# Patient Record
Sex: Male | Born: 1990 | Race: White | Hispanic: No | Marital: Single | Smoking: Current every day smoker
Health system: Southern US, Community
[De-identification: ages and names within clinical notes are randomized; demographics above are authoritative.]

## PROBLEM LIST (undated history)

## (undated) DIAGNOSIS — M24419 Recurrent dislocation, unspecified shoulder: Secondary | ICD-10-CM

## (undated) DIAGNOSIS — L02215 Cutaneous abscess of perineum: Secondary | ICD-10-CM

## (undated) DIAGNOSIS — F419 Anxiety disorder, unspecified: Secondary | ICD-10-CM

## (undated) DIAGNOSIS — R21 Rash and other nonspecific skin eruption: Secondary | ICD-10-CM

## (undated) DIAGNOSIS — M24411 Recurrent dislocation, right shoulder: Secondary | ICD-10-CM

## (undated) DIAGNOSIS — K219 Gastro-esophageal reflux disease without esophagitis: Secondary | ICD-10-CM

## (undated) DIAGNOSIS — G43909 Migraine, unspecified, not intractable, without status migrainosus: Secondary | ICD-10-CM

## (undated) HISTORY — DX: Migraine, unspecified, not intractable, without status migrainosus: G43.909

## (undated) HISTORY — PX: SHOULDER SURGERY: SHX246

## (undated) HISTORY — DX: Cutaneous abscess of perineum: L02.215

## (undated) HISTORY — PX: WISDOM TOOTH EXTRACTION: SHX21

---
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
## 2010-04-10 ENCOUNTER — Emergency Department (HOSPITAL_COMMUNITY): Admission: EM | Admit: 2010-04-10 | Discharge: 2010-04-10 | Payer: Self-pay | Admitting: Emergency Medicine

## 2011-12-22 HISTORY — PX: LEG SURGERY: SHX1003

## 2012-04-22 ENCOUNTER — Emergency Department (HOSPITAL_COMMUNITY)
Admission: EM | Admit: 2012-04-22 | Discharge: 2012-04-23 | Disposition: A | Discharge status: Home / Self Care | Payer: Medicaid Other | Attending: Emergency Medicine | Admitting: Emergency Medicine

## 2012-04-22 ENCOUNTER — Encounter (HOSPITAL_COMMUNITY): Payer: Self-pay | Admitting: *Deleted

## 2012-04-22 DIAGNOSIS — IMO0002 Reserved for concepts with insufficient information to code with codable children: Secondary | ICD-10-CM | POA: Insufficient documentation

## 2012-04-22 DIAGNOSIS — F17200 Nicotine dependence, unspecified, uncomplicated: Secondary | ICD-10-CM | POA: Insufficient documentation

## 2012-04-22 DIAGNOSIS — S81819A Laceration without foreign body, unspecified lower leg, initial encounter: Secondary | ICD-10-CM

## 2012-04-22 DIAGNOSIS — T07XXXA Unspecified multiple injuries, initial encounter: Secondary | ICD-10-CM

## 2012-04-22 DIAGNOSIS — S81009A Unspecified open wound, unspecified knee, initial encounter: Secondary | ICD-10-CM | POA: Insufficient documentation

## 2012-04-22 DIAGNOSIS — Z23 Encounter for immunization: Secondary | ICD-10-CM | POA: Insufficient documentation
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
## 2012-04-23 LAB — RAPID URINE DRUG SCREEN, HOSP PERFORMED
Amphetamines: NOT DETECTED
Barbiturates: NOT DETECTED
Benzodiazepines: NOT DETECTED

## 2012-04-23 MED ORDER — TETANUS-DIPHTH-ACELL PERTUSSIS 5-2.5-18.5 LF-MCG/0.5 IM SUSP
0.5000 mL | Freq: Once | INTRAMUSCULAR | Status: AC
  Administered 2012-04-23: 0.5 mL via INTRAMUSCULAR
  Filled 2012-04-23: qty 0.5

## 2012-04-23 MED ORDER — CEPHALEXIN 500 MG PO CAPS
500.0000 mg | ORAL_CAPSULE | Freq: Once | ORAL | Status: AC
  Administered 2012-04-23: 500 mg via ORAL
  Filled 2012-04-23: qty 1

## 2012-04-23 MED ORDER — BUPIVACAINE HCL (PF) 0.25 % IJ SOLN
INTRAMUSCULAR | Status: AC
  Filled 2012-04-23: qty 30

## 2012-04-23 MED ORDER — HYDROCODONE-ACETAMINOPHEN 5-325 MG PO TABS: 1.0000 | ORAL_TABLET | ORAL | Status: AC | PRN

## 2012-04-23 MED ORDER — CEPHALEXIN 500 MG PO CAPS: 500.0000 mg | ORAL_CAPSULE | Freq: Four times a day (QID) | ORAL | Status: AC

## 2012-04-23 NOTE — Discharge Instructions (Signed)
The staples will need to be removed in 10 days. You may return to the ER or see your doctor. Take all of the antibiotic.
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
## 2012-04-23 NOTE — ED Provider Notes (Deleted)
History     CSN: [id number]  Arrival date & time 04/22/12  2348   First MD Initiated Contact with Patient 04/22/12 2356      Chief Complaint  Patient presents with  . Leg Injury    (Consider location/radiation/quality/duration/timing/severity/associated sxs/prior treatment) HPI  History reviewed. No pertinent past medical history.  Past Surgical History  Procedure Date  . Shoulder surgery     No family history on file.  History  Substance Use Topics  . Smoking status: Current Everyday Smoker  . Smokeless tobacco: Not on file  . Alcohol Use: Yes      Review of Systems  Allergies  Review of patient's allergies indicates no known allergies.  Home Medications  No current outpatient prescriptions on file.  BP 132/81  Pulse 89  Temp(Src) 98.5 F (36.9 C) (Oral)  Resp 20  Ht 5\' 7"  (1.702 m)  Wt 145 lb (65.772 kg)  BMI 22.71 kg/m2  SpO2 99%  Physical Exam  ED Course  LACERATION REPAIR Date/Time: 04/23/2012 1:16 AM Performed by: Kathie Dike Authorized by: Kathie Dike Consent: Verbal consent obtained. Risks and benefits: risks, benefits and alternatives were discussed Consent given by: patient and parent Patient understanding: patient states understanding of the procedure being performed Patient identity confirmed: arm band Time out: Immediately prior to procedure a "time out" was called to verify the correct patient, procedure, equipment, support staff and site/side marked as required. Body area: lower extremity Location details: right lower leg Laceration length: 18.3 cm Foreign bodies: no foreign bodies Tendon involvement: none Nerve involvement: none Vascular damage: no Anesthesia: local infiltration Local anesthetic: bupivacaine 0.25% without epinephrine Patient sedated: no Preparation: Patient was prepped and draped in the usual sterile fashion. Irrigation solution: saline Amount of cleaning: extensive (surgical brush) Debridement:  none Degree of undermining: none Skin closure: staples Number of sutures: 31 Approximation: close Approximation difficulty: complex Dressing: 4x4 sterile gauze Patient tolerance: Patient tolerated the procedure well with no immediate complications. Comments: Sterile dressing applied by me.   (including critical care time)   Labs Reviewed  URINE RAPID DRUG SCREEN (HOSP PERFORMED)  ETHANOL   No results found.   No diagnosis found.    MDM  Medical screening examination/treatment/procedure(s) were conducted as a shared visit with non-physician practitioner(s) and myself.  I personally evaluated the patient during the encounter        Nicoletta Dress. Colon Branch, MD 04/23/12 0347

## 2012-05-02 ENCOUNTER — Emergency Department (HOSPITAL_COMMUNITY)
Admission: EM | Admit: 2012-05-02 | Discharge: 2012-05-02 | Disposition: A | Discharge status: Home / Self Care | Payer: Medicaid Other | Attending: Emergency Medicine | Admitting: Emergency Medicine

## 2012-05-02 ENCOUNTER — Encounter (HOSPITAL_COMMUNITY): Payer: Self-pay | Admitting: *Deleted

## 2012-05-02 DIAGNOSIS — Z5189 Encounter for other specified aftercare: Secondary | ICD-10-CM

## 2012-05-02 DIAGNOSIS — Z4802 Encounter for removal of sutures: Secondary | ICD-10-CM | POA: Insufficient documentation

## 2012-05-02 NOTE — Discharge Instructions (Signed)
Wound Care Wound care helps prevent pain and infection.  You may need a tetanus shot if:  You cannot remember when you had your last tetanus shot.   You have never had a tetanus shot.   The injury broke your skin.  If you need a tetanus shot and you choose not to have one, you may get tetanus. Sickness from tetanus can be serious. HOME CARE   Only take medicine as told by your doctor.   Clean the wound daily with mild soap and water.   Change any bandages (dressings) as told by your doctor.   Put medicated cream and a bandage on the wound as told by your doctor.   Change the bandage if it gets wet, dirty, or starts to smell.   Take showers. Do not take baths, swim, or do anything that puts your wound under water.   Rest and raise (elevate) the wound until the pain and puffiness (swelling) are better.   Keep all doctor visits as told.  GET HELP RIGHT AWAY IF:   Yellowish-white fluid (pus) comes from the wound.   Medicine does not lessen your pain.   There is a red streak going away from the wound.   You cannot move your finger or toe.   You have a fever.  MAKE SURE YOU:   Understand these instructions.   Will watch your condition.   Will get help right away if you are not doing well or get worse.  Document Released: 09/15/2008 Document Revised: 11/26/2011 Document Reviewed: 04/12/2011 Exit[employer] Patient Information 2012 [employer]. 

## 2012-05-02 NOTE — ED Notes (Signed)
Here for staple removal from rt leg.

## 2012-05-02 NOTE — ED Notes (Signed)
Pt here for staples to be removed from right lower leg

## 2012-05-02 NOTE — ED Provider Notes (Signed)
History     CSN: [id number]  Arrival date & time 05/02/12  1228   First MD Initiated Contact with Patient 05/02/12 1356      Chief Complaint  Patient presents with  . Wound Check    (Consider location/radiation/quality/duration/timing/severity/associated sxs/prior treatment) Patient is a 21 y.o. male presenting with wound check. The history is provided by the patient.  Wound Check  He was treated in the ED 5 to 10 days ago. Previous treatment in the ED includes laceration repair. Treatments since wound repair include oral antibiotics. Fever duration: no fever. Wound drainage status: minimal. There is no redness present. There is no swelling present. The pain has improved. Difficulty Moving Extremity/Digit: Pain with fully extending the right knee.    History reviewed. No pertinent past medical history.  Past Surgical History  Procedure Date  . Shoulder surgery     History reviewed. No pertinent family history.  History  Substance Use Topics  . Smoking status: Current Everyday Smoker  . Smokeless tobacco: Not on file  . Alcohol Use: Yes      Review of Systems  Constitutional: Negative for activity change.       All ROS Neg except as noted in HPI  HENT: Negative for nosebleeds and neck pain.   Eyes: Negative for photophobia and discharge.  Respiratory: Negative for cough, shortness of breath and wheezing.   Cardiovascular: Negative for chest pain and palpitations.  Gastrointestinal: Negative for abdominal pain and blood in stool.  Genitourinary: Negative for dysuria, frequency and hematuria.  Musculoskeletal: Negative for back pain and arthralgias.  Skin: Positive for wound.  Neurological: Negative for dizziness, seizures and speech difficulty.  Psychiatric/Behavioral: Negative for hallucinations and confusion.    Allergies  Review of patient's allergies indicates no known allergies.  Home Medications   Current Outpatient Rx  Name Route Sig Dispense Refill  .  CEPHALEXIN 500 MG PO CAPS Oral Take 1 capsule (500 mg total) by mouth 4 (four) times daily. 28 capsule 0  . HYDROCODONE-ACETAMINOPHEN 5-325 MG PO TABS Oral Take 1 tablet by mouth every 4 (four) hours as needed for pain. 15 tablet 0    BP 116/70  Pulse 53  Temp 98.2 F (36.8 C)  Resp 18  Ht 5\' 7"  (1.702 m)  Wt 165 lb (74.844 kg)  BMI 25.84 kg/m2  SpO2 99%  Physical Exam  Nursing note and vitals reviewed. Constitutional: He is oriented to person, place, and time. He appears well-developed and well-nourished.  Non-toxic appearance.  HENT:  Head: Normocephalic.  Right Ear: Tympanic membrane and external ear normal.  Left Ear: Tympanic membrane and external ear normal.  Eyes: EOM and lids are normal. Pupils are equal, round, and reactive to light.  Neck: Normal range of motion. Neck supple. Carotid bruit is not present.  Cardiovascular: Normal rate, regular rhythm, normal heart sounds, intact distal pulses and normal pulses.   Pulmonary/Chest: Breath sounds normal. No respiratory distress.  Abdominal: Soft. Bowel sounds are normal. There is no tenderness. There is no guarding.  Musculoskeletal: Normal range of motion.       Pain and difficulty extending the knee of the  right lower extremity. The stapled wound is healing nicely. No red streaking. No active drainage. No hot areas.  Lymphadenopathy:       Head (right side): No submandibular adenopathy present.       Head (left side): No submandibular adenopathy present.    He has no cervical adenopathy.  Neurological: He is alert and  oriented to person, place, and time. He has normal strength. No cranial nerve deficit or sensory deficit.  Skin: Skin is warm and dry.  Psychiatric: He has a normal mood and affect. His speech is normal.    ED Course  Procedures (including critical care time)  Labs Reviewed - No data to display No results found.   No diagnosis found.    MDM  I have reviewed nursing notes, vital signs, and all  appropriate lab and imaging results for this patient. Pt required a wound repair of the right lower leg on 5/4. He returns today for suture removal . Wound healing nicely. No evidence for infection.       Kathie Dike, [place] 05/02/12 1440

## 2012-05-03 NOTE — ED Provider Notes (Signed)
Medical screening examination/treatment/procedure(s) were performed by non-physician practitioner and as supervising physician I was immediately available for consultation/collaboration.   Laray Anger, DO 05/03/12 [phone]

## 2012-05-16 ENCOUNTER — Emergency Department (HOSPITAL_COMMUNITY)
Admission: EM | Admit: 2012-05-16 | Discharge: 2012-05-16 | Disposition: A | Discharge status: Home / Self Care | Payer: Medicaid Other | Attending: Emergency Medicine | Admitting: Emergency Medicine

## 2012-05-16 ENCOUNTER — Encounter (HOSPITAL_COMMUNITY): Payer: Self-pay | Admitting: Emergency Medicine

## 2012-05-16 DIAGNOSIS — T8130XA Disruption of wound, unspecified, initial encounter: Secondary | ICD-10-CM

## 2012-05-16 DIAGNOSIS — T8133XA Disruption of traumatic injury wound repair, initial encounter: Secondary | ICD-10-CM | POA: Insufficient documentation

## 2012-05-16 DIAGNOSIS — F17200 Nicotine dependence, unspecified, uncomplicated: Secondary | ICD-10-CM | POA: Insufficient documentation

## 2012-05-16 DIAGNOSIS — Y849 Medical procedure, unspecified as the cause of abnormal reaction of the patient, or of later complication, without mention of misadventure at the time of the procedure: Secondary | ICD-10-CM | POA: Insufficient documentation
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
## 2012-07-01 ENCOUNTER — Ambulatory Visit (HOSPITAL_COMMUNITY)
Admission: RE | Admit: 2012-07-01 | Discharge: 2012-07-01 | Disposition: A | Discharge status: Home / Self Care | Payer: Self-pay | Source: Ambulatory Visit | Attending: Preventative Medicine | Admitting: Preventative Medicine

## 2012-07-01 DIAGNOSIS — IMO0001 Reserved for inherently not codable concepts without codable children: Secondary | ICD-10-CM | POA: Insufficient documentation

## 2012-07-01 DIAGNOSIS — S81009A Unspecified open wound, unspecified knee, initial encounter: Secondary | ICD-10-CM | POA: Insufficient documentation
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
## 2012-07-04 ENCOUNTER — Ambulatory Visit (HOSPITAL_COMMUNITY): Payer: Medicaid Other | Admitting: Physical Therapy

## 2012-07-04 ENCOUNTER — Ambulatory Visit (HOSPITAL_COMMUNITY)
Admission: RE | Admit: 2012-07-04 | Discharge: 2012-07-04 | Disposition: A | Discharge status: Home / Self Care | Payer: Self-pay | Source: Ambulatory Visit | Attending: Physical Therapy | Admitting: Physical Therapy
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
## 2012-07-06 ENCOUNTER — Ambulatory Visit (HOSPITAL_COMMUNITY)
Admission: RE | Admit: 2012-07-06 | Discharge: 2012-07-06 | Disposition: A | Discharge status: Home / Self Care | Payer: Self-pay | Source: Ambulatory Visit

## 2012-07-06 NOTE — Progress Notes (Signed)
Physical Therapy - Wound Therapy  Treatment   Patient Details  Name: Rodney Brown MRN: [id number] Date of Birth: 03/03/1991  Today's Date: 07/06/2012 Time: 0940-1006 Time Calculation (min): 26 min  Visit#: 3  of 10   Re-eval: 07/15/12  Subjective Subjective Assessment Subjective: Pt states he is not as tender.    Pain Assessment Pain Assessment Pain Assessment: No/denies pain  Wound Therapy Wound 07/01/12 Degloving Leg Right;Lateral wound is triangular in shape. (Active)  Site / Wound Assessment Clean;Granulation tissue;Yellow 07/06/2012 10:08 AM  % Wound base Red or Granulating 40% 07/06/2012 10:08 AM  % Wound base Yellow 60% 07/06/2012 10:08 AM  % Wound base Black 0% 07/01/2012  1:52 PM  % Wound base Other (Comment) 0% 07/01/2012  1:52 PM  Peri-wound Assessment Intact 07/06/2012 10:08 AM  Wound Length (cm) 7 cm 07/04/2012  5:40 PM  Wound Width (cm) 4 cm 07/04/2012  5:40 PM  Wound Depth (cm) 0.3 cm 07/04/2012  5:40 PM  Margins Attached edges (approximated) 07/01/2012  1:52 PM  Closure None 07/06/2012 10:08 AM  Drainage Amount Minimal 07/06/2012 10:08 AM  Drainage Description Serosanguineous 07/06/2012 10:08 AM  Non-staged Wound Description Full thickness 07/06/2012 10:08 AM  Treatment Debridement (Selective);Hydrotherapy (Pulse lavage);Cleansed 07/06/2012 10:08 AM  Dressing Type Impregnated gauze (petrolatum) 07/06/2012 10:08 AM  Dressing Changed Changed 07/06/2012 10:08 AM  Dressing Status Clean;Dry;Intact 07/06/2012 10:08 AM   Hydrotherapy Pulsed lavage therapy - wound location: entire wound area Pulsed Lavage with Suction (psi): 100 psi Pulsed Lavage with Suction - Normal Saline Used: 1000 mL Pulsed Lavage Tip: Tip with splash shield Selective Debridement Selective Debridement - Location: wound bed Selective Debridement - Tools Used: Forceps;Scalpel Selective Debridement - Tissue Removed: slough   Physical Therapy Assessment and Plan Wound Therapy -  Assess/Plan/Recommendations Wound Therapy - Clinical Statement: Pt wound continues to increase in granulating tissue.  Pt much more tolerant of debridement Hydrotherapy Plan: Debridement;Dressing change;Pulsatile lavage with suction;Patient/family education Wound Therapy - Frequency: 3X / week Wound Therapy - Current Recommendations: PT Wound Plan: Decrease to 3x /week continue until wound is 100% granulating tissue       Goals    Problem List There is no problem list on file for this patient.   GP    RUSSELL,CINDY 07/06/2012, 10:20 AM

## 2012-07-08 ENCOUNTER — Ambulatory Visit (HOSPITAL_COMMUNITY)
Admission: RE | Admit: 2012-07-08 | Discharge: 2012-07-08 | Disposition: A | Discharge status: Home / Self Care | Payer: Self-pay | Source: Ambulatory Visit

## 2012-07-08 NOTE — Progress Notes (Signed)
Physical Therapy - Wound Therapy  Treatment   Patient Details  Name: Rodney Brown MRN: [id number] Date of Birth: 1991-11-09  Today's Date: 07/08/2012 Time: 0940-1005 Time Calculation (min): 25 min  Visit#: 3  of 10   Re-eval: 07/15/12  Subjective Subjective Assessment Subjective: Pt states the wound is starting to itch  Pain Assessment Pain Assessment Pain Assessment: No/denies pain  Wound Therapy Wound 07/01/12 Degloving Leg Right;Lateral wound is triangular in shape. (Active)  Site / Wound Assessment Clean;Granulation tissue;Yellow 07/08/2012 11:15 AM  % Wound base Red or Granulating 50% 07/08/2012 11:15 AM  % Wound base Yellow 50% 07/08/2012 11:15 AM  % Wound base Black 0% 07/01/2012  1:52 PM  % Wound base Other (Comment) 0% 07/01/2012  1:52 PM  Peri-wound Assessment Intact 07/06/2012 10:08 AM  Wound Length (cm) 7 cm 07/04/2012  5:40 PM  Wound Width (cm) 4 cm 07/04/2012  5:40 PM  Wound Depth (cm) 0.3 cm 07/04/2012  5:40 PM  Margins Unattacted edges (unapproximated) 07/08/2012 11:15 AM  Closure None 07/08/2012 11:15 AM  Drainage Amount Minimal 07/08/2012 11:15 AM  Drainage Description Serous 07/08/2012 11:15 AM  Non-staged Wound Description Full thickness 07/08/2012 11:15 AM  Treatment Cleansed;Debridement (Selective) 07/08/2012 11:15 AM  Dressing Type Impregnated gauze (petrolatum) 07/08/2012 11:15 AM  Dressing Changed Changed 07/08/2012 11:15 AM  Dressing Status Clean;Intact 07/08/2012 11:15 AM   Selective Debridement Selective Debridement - Location: entire wound area Selective Debridement - Tools Used: Forceps Selective Debridement - Tissue Removed: slough   Physical Therapy Assessment and Plan Wound Therapy - Assess/Plan/Recommendations Wound Therapy - Clinical Statement: Granulating tissue continues to increase.  Wound bed depth is rising. Hydrotherapy Plan: Debridement;Dressing change;Patient/family education Wound Plan: continue to assess  no pulse lavage this treatment if  wound drainage does not increase may discontinue pulse lavage.      Goals    Problem List There is no problem list on file for this patient.   GP    RUSSELL,CINDY 07/08/2012, 11:40 AM

## 2012-07-11 ENCOUNTER — Ambulatory Visit (HOSPITAL_COMMUNITY): Payer: Self-pay | Admitting: Physical Therapy

## 2012-07-13 ENCOUNTER — Ambulatory Visit (HOSPITAL_COMMUNITY)
Admission: RE | Admit: 2012-07-13 | Discharge: 2012-07-13 | Disposition: A | Discharge status: Home / Self Care | Payer: Self-pay | Source: Ambulatory Visit | Attending: Preventative Medicine | Admitting: Preventative Medicine

## 2012-07-13 NOTE — Progress Notes (Signed)
Physical Therapy - Wound Therapy  Treatment   Patient Details  Name: NYAIRE DENBLEYKER MRN: [id number] Date of Birth: [birth year]  Today's Date: 07/13/2012 Time: 4098-1191 Time Calculation (min): 25 min  Visit#: 4  of 10   Re-eval: 07/15/12  Subjective Subjective Assessment Subjective: Pt states he was unable to come on Monday.   Minimal pain.   Patient and Family Stated Goals: Pt states he is eager for the wound to heal  Pain Assessment Pain Assessment Pain Assessment: No/denies pain  Wound Therapy Wound 07/01/12 Degloving Leg Right;Lateral wound is triangular in shape. (Active)  Site / Wound Assessment Clean;Granulation tissue 07/13/2012  1:12 PM  % Wound base Red or Granulating 65% 07/13/2012  1:12 PM  % Wound base Yellow 35% 07/13/2012  1:12 PM  % Wound base Black 0% 07/01/2012  1:52 PM  % Wound base Other (Comment) 0% 07/01/2012  1:52 PM  Peri-wound Assessment Intact 07/13/2012  1:12 PM  Wound Length (cm) 6.3 cm 07/13/2012  1:12 PM  Wound Width (cm) 4 cm 07/13/2012  1:12 PM  Wound Depth (cm) 0.15 cm 07/13/2012  1:12 PM  Margins Epibole (rolled edges) 07/13/2012  1:12 PM  Closure None 07/13/2012  1:12 PM  Drainage Amount Minimal 07/13/2012  1:12 PM  Drainage Description Serous 07/13/2012  1:12 PM  Non-staged Wound Description Full thickness 07/13/2012  1:12 PM  Treatment Cleansed;Debridement (Selective) 07/13/2012  1:12 PM  Dressing Type Compression wrap;Impregnated gauze (petrolatum) 07/13/2012  1:12 PM  Dressing Changed New 07/13/2012  1:12 PM  Dressing Status Clean 07/13/2012  1:12 PM   Selective Debridement Selective Debridement - Location: throughout the wound- eschar is marbled in throughout granulating tissue. Selective Debridement - Tools Used: Forceps Selective Debridement - Tissue Removed: slough   Physical Therapy Assessment and Plan Wound Therapy - Assess/Plan/Recommendations Wound Therapy - Clinical Statement: Wound continues to decrease in size and improve in granulation  tissue. Hydrotherapy Plan: Debridement;Dressing change Wound Therapy - Frequency: 3X / week Wound Therapy - Current Recommendations: PT Wound Plan: cleanse, debride and compression dressing change.      Goals    Problem List There is no problem list on file for this patient.   GP    RUSSELL,CINDY 07/13/2012, 1:17 PM

## 2012-07-15 ENCOUNTER — Ambulatory Visit (HOSPITAL_COMMUNITY)
Admission: RE | Admit: 2012-07-15 | Discharge: 2012-07-15 | Disposition: A | Discharge status: Home / Self Care | Payer: Self-pay | Source: Ambulatory Visit | Attending: Physical Therapy | Admitting: Physical Therapy

## 2012-07-15 NOTE — Progress Notes (Signed)
Physical Therapy - Wound Therapy  Treatment   Patient Details  Name: BELDON NOWLING MRN: [id number] Date of Birth: 27-Apr-1991  Today's Date: 07/15/2012 Time: 1125 (pt 25' late for appt)-1150 Time Calculation (min): 25 min  Visit#: 5  of 10   Re-eval: 07/15/12  Subjective Subjective Assessment Subjective: Pt states that he does not have any pain at entry.  Pain Assessment Pain Assessment Pain Assessment: No/denies pain  Wound Therapy Wound 07/01/12 Degloving Leg Right;Lateral wound is triangular in shape. (Active)  Site / Wound Assessment Clean;Granulation tissue 07/15/2012 11:51 AM  % Wound base Red or Granulating 80% 07/15/2012 11:51 AM  % Wound base Yellow 20% 07/15/2012 11:51 AM  % Wound base Black 0% 07/15/2012 11:51 AM  % Wound base Other (Comment) 0% 07/15/2012 11:51 AM  Peri-wound Assessment Intact 07/15/2012 11:51 AM  Wound Length (cm) 6.3 cm 07/13/2012  1:12 PM  Wound Width (cm) 4 cm 07/13/2012  1:12 PM  Wound Depth (cm) 0.15 cm 07/13/2012  1:12 PM  Margins Epibole (rolled edges) 07/15/2012 11:51 AM  Closure None 07/15/2012 11:51 AM  Drainage Amount Minimal 07/15/2012 11:51 AM  Drainage Description Serous 07/15/2012 11:51 AM  Non-staged Wound Description Full thickness 07/15/2012 11:51 AM  Treatment Cleansed;Debridement (Selective) 07/15/2012 11:51 AM  Dressing Type Compression wrap;Impregnated gauze (bismuth) 07/15/2012 11:51 AM  Dressing Changed New 07/15/2012 11:51 AM  Dressing Status Clean;Dry;Intact 07/15/2012 11:51 AM   Selective Debridement Selective Debridement - Location: throughout the wound slough is marbled in throughout granulating tissue. Selective Debridement - Tools Used: Forceps;Scalpel Selective Debridement - Tissue Removed: slough and dry skin at perimeter   Physical Therapy Assessment and Plan Wound Therapy - Assess/Plan/Recommendations Wound Therapy - Clinical Statement: Wound continues to progress well. Wound is bright, healthy red but does have slough  marbled throughout. Pt somewhat sensitive to debridement but reports 0/10 pain at end of session. Wound Plan: Continue to progress per PT POC.       Problem List There is no problem list on file for this patient.    Seth Bake, PTA 07/15/2012, 11:58 AM

## 2012-07-18 ENCOUNTER — Ambulatory Visit (HOSPITAL_COMMUNITY): Payer: Self-pay | Admitting: Physical Therapy

## 2012-07-20 ENCOUNTER — Ambulatory Visit (HOSPITAL_COMMUNITY): Payer: Self-pay | Admitting: Physical Therapy

## 2012-07-20 ENCOUNTER — Ambulatory Visit (HOSPITAL_COMMUNITY)
Admission: RE | Admit: 2012-07-20 | Discharge: 2012-07-20 | Disposition: A | Discharge status: Home / Self Care | Payer: Self-pay | Source: Ambulatory Visit | Attending: Preventative Medicine | Admitting: Preventative Medicine

## 2012-07-20 NOTE — Progress Notes (Signed)
Physical Therapy - Wound Therapy Re-evaluation  Treatment   Patient Details  Name: Rodney Brown MRN: [id number] Date of Birth: 1991/01/08  Today's Date: 07/20/2012 Time: 2841-3244 Time Calculation (min): 25 min  Visit#: 6  of 18   Re-eval: 08/19/12 Charges: Selective debridement (= or < 20 cm)   Subjective Subjective Assessment Subjective: "It looks a lot better."  (after dressing was removed)  Pain Assessment Pain Assessment Pain Assessment: No/denies pain  Wound Therapy Wound 07/01/12 Degloving Leg Right;Lateral wound is triangular in shape. (Active)  Site / Wound Assessment Clean;Granulation tissue 07/20/2012  1:30 PM  % Wound base Red or Granulating 85% 07/20/2012  1:30 PM  % Wound base Yellow 15% 07/20/2012  1:30 PM  % Wound base Black 0% 07/20/2012  1:30 PM  % Wound base Other (Comment) 0% 07/20/2012  1:30 PM  Peri-wound Assessment Intact 07/20/2012  1:30 PM  Wound Length (cm) 6.3 cm 07/20/2012  1:30 PM  Wound Width (cm) 3 cm 07/20/2012  1:30 PM  Wound Depth (cm) 0.1 cm 07/20/2012  1:30 PM  Margins Epibole (rolled edges) 07/20/2012  1:30 PM  Closure None 07/20/2012  1:30 PM  Drainage Amount Minimal 07/20/2012  1:30 PM  Drainage Description Serous 07/20/2012  1:30 PM  Non-staged Wound Description Full thickness 07/15/2012 11:51 AM  Treatment Cleansed;Debridement (Selective) 07/20/2012  1:30 PM  Dressing Type Compression wrap;Impregnated gauze (bismuth) 07/20/2012  1:30 PM  Dressing Changed New 07/20/2012  1:30 PM  Dressing Status Clean;Dry;Intact 07/20/2012  1:30 PM   Selective Debridement Selective Debridement - Location: throughout the wound slough is marbled in throughout granulating tissue. Selective Debridement - Tools Used: Forceps Selective Debridement - Tissue Removed: slough and dry skin at perimeter   Physical Therapy Assessment and Plan Wound Therapy - Assess/Plan/Recommendations Wound Therapy - Clinical Statement: Wound appears to be progressing well. Wound has  decreased in size and presents with improved granulation. Wound is without signs or symptoms of infection. Wound Plan: Recommend to continue wound care x 4 wks.       Goals Wound Therapy Goals - Improve the function of patient's integumentary system by progressing the wound(s) through the phases of wound healing by: Decrease Necrotic Tissue to: 0 Decrease Necrotic Tissue - Progress: Progressing toward goal Increase Granulation Tissue to: 100 Increase Granulation Tissue - Progress: Progressing toward goal Decrease Length/Width/Depth by (cm): 2x1.5x.2 Decrease Length/Width/Depth - Progress: Progressing toward goal Improve Drainage Characteristics:  (scant) Improve Drainage Characteristics - Progress: Progressing toward goal Patient/Family will be able to : verbalize proper dressing and care for wound Patient/Family Instruction Goal - Progress: Progressing toward goal Goals/treatment plan/discharge plan were made with and agreed upon by patient/family: Yes Time For Goal Achievement: 2 weeks Wound Therapy - Potential for Goals: Good  Problem List There is no problem list on file for this patient.    Seth Bake, PTA 07/20/2012, 1:36 PM

## 2012-07-25 ENCOUNTER — Ambulatory Visit (HOSPITAL_COMMUNITY)
Admission: RE | Admit: 2012-07-25 | Discharge: 2012-07-25 | Disposition: A | Discharge status: Home / Self Care | Payer: Self-pay | Source: Ambulatory Visit | Attending: Preventative Medicine | Admitting: Preventative Medicine

## 2012-07-25 ENCOUNTER — Ambulatory Visit (HOSPITAL_COMMUNITY): Payer: Self-pay | Admitting: Physical Therapy

## 2012-07-25 DIAGNOSIS — S91009A Unspecified open wound, unspecified ankle, initial encounter: Secondary | ICD-10-CM | POA: Insufficient documentation

## 2012-07-25 DIAGNOSIS — IMO0001 Reserved for inherently not codable concepts without codable children: Secondary | ICD-10-CM | POA: Insufficient documentation

## 2012-07-25 DIAGNOSIS — S81009A Unspecified open wound, unspecified knee, initial encounter: Secondary | ICD-10-CM | POA: Insufficient documentation

## 2012-07-25 NOTE — Progress Notes (Signed)
Physical Therapy - Wound Therapy  Treatment   Patient Details  Name: GIOVANNE NICKOLSON MRN: [id number] Date of Birth: 1991-04-12  Today's Date: 07/25/2012 Time: 4098-1191 Time Calculation (min): 27 min  Visit#: 7  of 18   Re-eval: 08/08/12  Subjective Subjective Assessment Subjective: Pt states it looks like it's closed in even from last treatment I had.  Pain Assessment Pain Assessment Pain Assessment: No/denies pain  Wound Therapy Wound 07/01/12 Degloving Leg Right;Lateral wound is triangular in shape. (Active)  Site / Wound Assessment Granulation tissue 07/25/2012 12:20 PM  % Wound base Red or Granulating 85% 07/25/2012 12:20 PM  % Wound base Yellow 15% 07/25/2012 12:20 PM  % Wound base Black 0% 07/20/2012  1:30 PM  % Wound base Other (Comment) 0% 07/20/2012  1:30 PM  Peri-wound Assessment Intact 07/20/2012  1:30 PM  Wound Length (cm) 6.3 cm 07/20/2012  1:30 PM  Wound Width (cm) 3 cm 07/20/2012  1:30 PM  Wound Depth (cm) 0.1 cm 07/20/2012  1:30 PM  Margins Epibole (rolled edges) 07/25/2012 12:20 PM  Closure None 07/25/2012 12:20 PM  Drainage Amount Minimal 07/25/2012 12:20 PM  Drainage Description Serous 07/25/2012 12:20 PM  Non-staged Wound Description Full thickness 07/25/2012 12:20 PM  Treatment Cleansed;Debridement (Selective) 07/25/2012 12:20 PM  Dressing Type Impregnated gauze (petrolatum) 07/25/2012 12:20 PM  Dressing Changed Changed 07/25/2012 12:20 PM  Dressing Status Clean 07/25/2012 12:20 PM   Selective Debridement Selective Debridement - Location: throughout wound bed and to edges of wound. Selective Debridement - Tools Used: Forceps   Physical Therapy Assessment and Plan Wound Therapy - Assess/Plan/Recommendations Wound Therapy - Clinical Statement: Wound continues to decrease in size.  Pt is keeping dressings done by therapist on until next treatment day. Hydrotherapy Plan: Debridement;Dressing change;Patient/family education Wound Therapy - Frequency: 3X / week Wound Therapy -  Current Recommendations: PT Wound Plan: Continue wound care for two more weeks than reassess       Goals Wound Therapy Goals - Improve the function of patient's integumentary system by progressing the wound(s) through the phases of wound healing by: Decrease Necrotic Tissue - Progress: Progressing toward goal Increase Granulation Tissue - Progress: Progressing toward goal Decrease Length/Width/Depth - Progress: Progressing toward goal Improve Drainage Characteristics - Progress: Progressing toward goal  Problem List There is no problem list on file for this patient.   GP    Modene Andy,CINDY 07/25/2012, 12:28 PM

## 2012-07-27 ENCOUNTER — Ambulatory Visit (HOSPITAL_COMMUNITY)
Admission: RE | Admit: 2012-07-27 | Discharge: 2012-07-27 | Disposition: A | Discharge status: Home / Self Care | Payer: Self-pay | Source: Ambulatory Visit

## 2012-07-27 NOTE — Progress Notes (Signed)
Physical Therapy - Wound Therapy  Treatment   Patient Details  Name: Rodney Brown MRN: [id number] Date of Birth: March 08, 1991  Today's Date: 07/27/2012 Time: 2831-5176 Time Calculation (min): 23 min  Visit#: 8  of 18   Re-eval: 08/08/12  Subjective Subjective Assessment Subjective: Pt very happy with legs progress, no pain today.  Pain Assessment Pain Assessment Pain Assessment: No/denies pain  Wound Therapy Wound 07/01/12 Degloving Leg Right;Lateral wound is triangular in shape. (Active)  Site / Wound Assessment Granulation tissue 07/27/2012 11:46 AM  % Wound base Red or Granulating 90% 07/27/2012 11:46 AM  % Wound base Yellow 10% 07/27/2012 11:46 AM  % Wound base Black 0% 07/27/2012 11:46 AM  % Wound base Other (Comment) 0% 07/20/2012  1:30 PM  Peri-wound Assessment Intact 07/27/2012 11:46 AM  Wound Length (cm) 4.8 cm 07/27/2012 11:46 AM  Wound Width (cm) 1.75 cm 07/27/2012 11:46 AM  Wound Depth (cm) 0 cm 07/27/2012 11:46 AM  Margins Epibole (rolled edges) 07/27/2012 11:46 AM  Closure None 07/27/2012 11:46 AM  Drainage Amount Minimal 07/27/2012 11:46 AM  Drainage Description Serous 07/27/2012 11:46 AM  Non-staged Wound Description Full thickness 07/27/2012 11:46 AM  Treatment Cleansed;Debridement (Selective) 07/27/2012 11:46 AM  Dressing Type Impregnated gauze (petrolatum) 07/27/2012 11:46 AM  Dressing Changed Changed 07/27/2012 11:46 AM  Dressing Status Clean;Dry;Intact 07/27/2012 11:46 AM   Selective Debridement Selective Debridement - Location: R lateral LE Selective Debridement - Tools Used: Forceps Selective Debridement - Tissue Removed: dry skin at perimeter   Physical Therapy Assessment and Plan Wound Therapy - Assess/Plan/Recommendations Wound Therapy - Clinical Statement: Wound continues to decrease in size.  Measurements complete through tracing with decrease in size both length and width and no depth noted this session.  Pt tolerated well towards total treatment with no c/o  pain. Hydrotherapy Plan: Debridement;Dressing change;Patient/family education Wound Plan: Continue wound care, pt to return to referring MD next Monday 08/01/2012.      Goals    Problem List There is no problem list on file for this patient.   GP    Juel Burrow 07/27/2012, 12:04 PM

## 2012-07-29 ENCOUNTER — Ambulatory Visit (HOSPITAL_COMMUNITY)
Admission: RE | Admit: 2012-07-29 | Discharge: 2012-07-29 | Disposition: A | Discharge status: Home / Self Care | Payer: Self-pay | Source: Ambulatory Visit

## 2012-07-29 NOTE — Progress Notes (Signed)
Physical Therapy - Wound Therapy  Treatment   Patient Details  Name: Rodney Brown MRN: [id number] Date of Birth: Jun 11, 1991  Today's Date: 07/29/2012 Time: 2841-3244 Time Calculation (min): 35 min  Visit#: 9  of 18   Re-eval: 08/08/12  Subjective Subjective Assessment Subjective: No pain today, pt very happy with wounds progress.  Pain Assessment Pain Assessment Pain Assessment: No/denies pain  Wound Therapy  07/29/12 1540  Subjective Assessment  Subjective No pain today, pt very happy with wounds progress.  Wound 07/01/12 Degloving Leg Right;Lateral wound is triangular in shape.  Date First Assessed/Time First Assessed: 07/01/12 1130   Wound Type: Degloving  Location: Leg  Location Orientation: Right;Lateral  Wound Description (Comments): wound is triangular in shape.  Present on Admission: Yes  Site / Wound Assessment Granulation tissue  % Wound base Red or Granulating 90%  % Wound base Yellow 10%  % Wound base Black 0%  Peri-wound Assessment Intact  Wound Length (cm) 4 cm  Wound Width (cm) (superficial 2.5, inferior 0.8)  Wound Depth (cm) 0 cm  Margins Attached edges (approximated)  Closure None  Drainage Amount Minimal  Drainage Description Serous  Non-staged Wound Description Full thickness  Treatment Cleansed;Debridement (Selective)  Dressing Type Impregnated gauze (petrolatum)  Dressing Changed Changed  Selective Debridement  Selective Debridement - Location R lateral LE  Selective Debridement - Tools Used Forceps  Selective Debridement - Tissue Removed dry skin at perimeter  Wound Therapy - Assess/Plan/Recommendations  Wound Therapy - Clinical Statement Wound continue to improve in overall size.  Measurements: L:4.0 cm, W:superficial 2.5 cm, inferior 0.8 cm.  Pt tolerated well towards total treatment with no c/o of pain.  Hydrotherapy Plan Debridement;Dressing change;Patient/family education  Wound Plan Continue wound care    Selective  Debridement Selective Debridement - Location: R lateral LE Selective Debridement - Tools Used: Forceps Selective Debridement - Tissue Removed: dry skin at perimeter   Physical Therapy Assessment and Plan Wound Therapy - Assess/Plan/Recommendations Wound Therapy - Clinical Statement: Wound continue to improve in overall size.  Measurements taken prior MD apt next week: L:4.0 cm, W:superficial 2.5 cm, inferior 0.8 cm.  Pt tolerated well towards total treatment with no c/o of pain. Hydrotherapy Plan: Debridement;Dressing change;Patient/family education Wound Plan: Continue wound care      Goals    Problem List There is no problem list on file for this patient.   GP    Juel Burrow 07/29/2012, 3:51 PM

## 2012-08-03 ENCOUNTER — Ambulatory Visit (HOSPITAL_COMMUNITY): Payer: Self-pay | Admitting: *Deleted

## 2012-08-10 ENCOUNTER — Ambulatory Visit (HOSPITAL_COMMUNITY)
Admission: RE | Admit: 2012-08-10 | Discharge: 2012-08-10 | Disposition: A | Discharge status: Home / Self Care | Payer: Self-pay | Source: Ambulatory Visit

## 2012-08-10 NOTE — Progress Notes (Signed)
Physical Therapy - Wound Therapy  Treatment   Patient Details  Name: Rodney Brown MRN: [id number] Date of Birth: Jan 15, 1991  Today's Date: 08/10/2012 Time: 4098-1191 Time Calculation (min): 22 min  Visit#: 10  of 18   Re-eval: 09/07/12 Charge: selective debridement <20 cm  Subjective Subjective Assessment Subjective: No pain, pt concerned with wound's progress after missing last week's wound care sessions.  Pt has been compliant with wound care at home with cleaning and dressings.  Pain Assessment Pain Assessment Pain Assessment: No/denies pain  Wound Therapy  08/10/12 1553  Subjective Assessment  Subjective No pain, pt concerned with wound's progress after missing last week's wound care sessions.  Pt has been compliant with wound care at home with cleaning and dressings.  Wound 07/01/12 Degloving Leg Right;Lateral wound is triangular in shape.  Date First Assessed/Time First Assessed: 07/01/12 1130   Wound Type: Degloving  Location: Leg  Location Orientation: Right;Lateral  Wound Description (Comments): wound is triangular in shape.  Present on Admission: Yes  Site / Wound Assessment Granulation tissue  % Wound base Red or Granulating 95%  % Wound base Yellow 5%  % Wound base Black 0%  Peri-wound Assessment Intact  Wound Length (cm) 4.5 cm  Wound Width (cm) 1 cm  Wound Depth (cm) 0 cm  Margins Attached edges (approximated)  Closure None  Drainage Amount Scant  Drainage Description Serous  Non-staged Wound Description Full thickness  Treatment Cleansed;Debridement (Selective)  Dressing Type Impregnated gauze (petrolatum)  Dressing Changed Changed  Dressing Status Clean;Dry;Intact  Selective Debridement  Selective Debridement - Location R lateral LE  Selective Debridement - Tools Used Forceps  Selective Debridement - Tissue Removed dry skin at perimeter  Wound Therapy - Assess/Plan/Recommendations  Wound Therapy - Clinical Statement Wound continues to increase in  granulation, decrease in length and width with no depth.  Pt tolerated well towards total treatment with no c/o pain during debridement.  Hydrotherapy Plan Debridement;Dressing change;Patient/family education  Wound Plan Recommend continuing wound care for another 2 weeks, until wound is fully granulated    Selective Debridement Selective Debridement - Location: R lateral LE Selective Debridement - Tools Used: Forceps Selective Debridement - Tissue Removed: dry skin at perimeter   Physical Therapy Assessment and Plan Wound Therapy - Assess/Plan/Recommendations Wound Therapy - Clinical Statement: Wound continues to increase in granulation, decrease in length and width with no depth.  Pt tolerated well towards total treatment with no c/o pain during debridement. Hydrotherapy Plan: Debridement;Dressing change;Patient/family education Wound Plan: Recommend continuing wound care for another 4 weeks, until fully granulated      Goals Wound Therapy Goals - Improve the function of patient's integumentary system by progressing the wound(s) through the phases of wound healing by: Decrease Necrotic Tissue to: 0 Decrease Necrotic Tissue - Progress: Progressing toward goal Increase Granulation Tissue to: 100 Increase Granulation Tissue - Progress: Progressing toward goal Decrease Length/Width/Depth by (cm): 2x1.5x.2 Decrease Length/Width/Depth - Progress: Progressing toward goal Improve Drainage Characteristics:  (scant) Improve Drainage Characteristics - Progress: Met Patient/Family will be able to : verbalize proper dressing and care for wound Patient/Family Instruction Goal - Progress: Met Goals/treatment plan/discharge plan were made with and agreed upon by patient/family: Yes Time For Goal Achievement: 2 weeks Wound Therapy - Potential for Goals: Good  Problem List There is no problem list on file for this patient.   GP    Juel Burrow 08/10/2012, 4:09 PM

## 2012-08-25 ENCOUNTER — Ambulatory Visit (HOSPITAL_COMMUNITY): Payer: Self-pay | Admitting: Physical Therapy

## 2012-08-29 ENCOUNTER — Ambulatory Visit (HOSPITAL_COMMUNITY)
Admission: RE | Admit: 2012-08-29 | Discharge: 2012-08-29 | Disposition: A | Discharge status: Home / Self Care | Payer: Self-pay | Source: Ambulatory Visit | Attending: Preventative Medicine | Admitting: Preventative Medicine

## 2012-08-29 DIAGNOSIS — S81009A Unspecified open wound, unspecified knee, initial encounter: Secondary | ICD-10-CM | POA: Insufficient documentation

## 2012-08-29 DIAGNOSIS — IMO0001 Reserved for inherently not codable concepts without codable children: Secondary | ICD-10-CM | POA: Insufficient documentation

## 2012-08-29 DIAGNOSIS — S91009A Unspecified open wound, unspecified ankle, initial encounter: Secondary | ICD-10-CM | POA: Insufficient documentation

## 2012-08-29 NOTE — Progress Notes (Signed)
Physical Therapy - Wound Therapy  Treatment   Patient Details  Name: Rodney Brown MRN: [id number] Date of Birth: Dec 19, 1991  Today's Date: 08/29/2012 Time: 4098-1191 Time Calculation (min): 20 min  Visit#: 11  of 18   Re-eval: 09/07/12 Charges: Selective debridement (= or < 20 cm)   Subjective Subjective Assessment Subjective: Pt continues to show concerns about wound's progress.  Pain Assessment Pain Assessment Pain Assessment: No/denies pain  Wound Therapy Wound 07/01/12 Degloving Leg Right;Lateral wound is triangular in shape. (Active)  Site / Wound Assessment Granulation tissue 08/29/2012  9:33 AM  % Wound base Red or Granulating 100% 08/29/2012  9:33 AM  % Wound base Yellow 0% 08/29/2012  9:33 AM  % Wound base Black 0% 08/29/2012  9:33 AM  % Wound base Other (Comment) 0% 07/20/2012  1:30 PM  Peri-wound Assessment Intact 08/29/2012  9:33 AM  Wound Length (cm) 4.5 cm 08/10/2012  3:53 PM  Wound Width (cm) 1 cm 08/10/2012  3:53 PM  Wound Depth (cm) 0 cm 08/10/2012  3:53 PM  Margins Attached edges (approximated) 08/29/2012  9:33 AM  Closure None 08/29/2012  9:33 AM  Drainage Amount Scant 08/29/2012  9:33 AM  Drainage Description Serous 08/29/2012  9:33 AM  Non-staged Wound Description Full thickness 08/29/2012  9:33 AM  Treatment Cleansed;Debridement (Selective) 08/10/2012  3:53 PM  Dressing Type Impregnated gauze (bismuth) 08/29/2012  9:33 AM  Dressing Changed Changed 08/29/2012  9:33 AM  Dressing Status Clean;Dry;Intact 08/29/2012  9:33 AM   Selective Debridement Selective Debridement - Location: R lateral LE Selective Debridement - Tools Used: Forceps Selective Debridement - Tissue Removed: Very small amount of necrotic tissue; wound 100% granulated at end of session   Physical Therapy Assessment and Plan Wound Therapy - Assess/Plan/Recommendations Wound Therapy - Clinical Statement: Wound is 100% granulated. Pt continues to be concerned about wounds progress but wound appears healthy. Pt  continues to dress wound at home. Pt reports no pain throughout session. Wound Plan: Reassess next session.       Problem List There is no problem list on file for this patient.  Seth Bake, PTA 08/29/2012, 9:48 AM

## 2012-09-01 ENCOUNTER — Ambulatory Visit (HOSPITAL_COMMUNITY)
Admission: RE | Admit: 2012-09-01 | Discharge: 2012-09-01 | Disposition: A | Discharge status: Home / Self Care | Payer: Self-pay | Source: Ambulatory Visit

## 2012-09-01 NOTE — Progress Notes (Signed)
Physical Therapy - Wound Therapy Re-evalution  Evaluation/ Re-evaluation   Patient Details  Name: Rodney Brown MRN: [id number] Date of Birth: September 15, 1991  Today's Date: 09/01/2012 Time: 1022-1040 Time Calculation (min): 18 min  Visit#: 12  of 18   Re-eval: 09/29/12  Subjective Subjective Assessment Subjective: Pt. states his leg is healing slowly.  No pain or difficulties.  States he is holding his leg outside the shower when bathing.  Pain Assessment Pain Assessment Pain Assessment: No/denies pain  Wound Therapy; measurements compared to 08/10/12 visit Wound 07/01/12 Degloving Leg Right;Lateral wound is triangular in shape. (Active)  Site / Wound Assessment Granulation tissue 09/01/2012 10:46 AM  % Wound base Red or Granulating 100% 09/01/2012 10:46 AM  % Wound base Yellow 0% 09/01/2012 10:46 AM  % Wound base Black 0% 09/01/2012 10:46 AM  % Wound base Other (Comment) 0% 09/01/2012 10:46 AM  Peri-wound Assessment Intact 09/01/2012 10:46 AM  Wound Length (cm) 4 cm (was 4.5cm) 09/01/2012 10:46 AM  Wound Width (cm) 0.75 cm (was 1 cm) 09/01/2012 10:46 AM  Wound Depth (cm) 0 cm 09/01/2012 10:46 AM  Margins Attached edges (approximated) 09/01/2012 10:46 AM  Closure None 09/01/2012 10:46 AM  Drainage Amount Scant 09/01/2012 10:46 AM  Drainage Description Serous 09/01/2012 10:46 AM  Non-staged Wound Description Not applicable 09/01/2012 10:46 AM  Treatment Cleansed;Debridement (Selective) 08/10/2012  3:53 PM  Dressing Type Hydrogel;Silver dressings;Compression wrap 09/01/2012 10:46 AM  Dressing Changed Changed 09/01/2012 10:46 AM  Dressing Status Clean;Dry;Intact 09/01/2012 10:46 AM   Selective Debridement Selective Debridement - Location: R lateral LE Selective Debridement - Tools Used: Forceps Selective Debridement - Tissue Removed: slough   Physical Therapy Assessment and Plan Wound Therapy - Assess/Plan/Recommendations Wound Therapy - Clinical Statement: Wound is 100% granulated with some  hypergranulation.  Compression applied to help decrease hypergranulation.  Changed dressing to aquacel, silver with hydrogel to promote closure.  Pt. is compliant with care at home and there are no signs/symptoms of infection in wound. Hydrotherapy Plan: Debridement;Dressing change Wound Therapy - Frequency: 2 times a week Wound Plan: Request to continue woundcare      Goals Wound Therapy Goals - Improve the function of patient's integumentary system by progressing the wound(s) through the phases of wound healing by: Decrease Necrotic Tissue to 0% - Progress: Met Increase Granulation Tissue to 100% - Progress: Met Decrease Length/Width/Depth to 2X1.5X0.2cm - Progress: Partly met (measurements are 4 X 0.75 X 0 cm) Improve Drainage Characteristics to scant - Progress: Met Patient/Family Instruction Goal for self care - Progress: Met   Lurena Nida, PTA/CLT 09/01/2012, 10:54 AM

## 2012-09-05 ENCOUNTER — Ambulatory Visit (HOSPITAL_COMMUNITY)
Admission: RE | Admit: 2012-09-05 | Discharge: 2012-09-05 | Disposition: A | Discharge status: Home / Self Care | Payer: Self-pay | Source: Ambulatory Visit

## 2012-09-05 NOTE — Progress Notes (Signed)
Physical Therapy - Wound Therapy  Treatment   Patient Details  Name: Rodney Brown MRN: [id number] Date of Birth: [birth year]  Today's Date: 09/05/2012 Time: 1320 (Pt 20' late for appt)-1345 Time Calculation (min): 25 min  Visit#: 13  of 26   Re-eval: 09/29/12 Charges: Selective debridement (= or < 20 cm)   Subjective Subjective Assessment Subjective: Pt states that his wound was left open overnight this weekend because he did not realize the dressing had come off.  Pain Assessment Pain Assessment Pain Assessment: 0-10 Pain Score:   3 Pain Location: Leg Pain Orientation: Right;Lateral  Wound Therapy Wound 07/01/12 Degloving Leg Right;Lateral wound is triangular in shape. (Active)  Site / Wound Assessment Granulation tissue 09/05/2012  2:54 PM  % Wound base Red or Granulating 90% 09/05/2012  2:54 PM  % Wound base Yellow 10% 09/05/2012  2:54 PM  % Wound base Black 0% 09/05/2012  2:54 PM  % Wound base Other (Comment) 0% 09/05/2012  2:54 PM  Peri-wound Assessment Intact 09/05/2012  2:54 PM  Wound Length (cm) 4 cm 09/01/2012 10:46 AM  Wound Width (cm) 0.75 cm 09/01/2012 10:46 AM  Wound Depth (cm) 0 cm 09/01/2012 10:46 AM  Margins Attached edges (approximated) 09/05/2012  2:54 PM  Closure None 09/05/2012  2:54 PM  Drainage Amount Scant 09/05/2012  2:54 PM  Drainage Description Serous 09/05/2012  2:54 PM  Non-staged Wound Description Not applicable 09/05/2012  2:54 PM  Treatment Cleansed;Debridement (Selective) 09/05/2012  2:54 PM  Dressing Type Hydrogel;Silver dressings;Compression wrap 09/05/2012  2:54 PM  Dressing Changed Changed 09/05/2012  2:54 PM  Dressing Status Clean;Dry;Intact 09/05/2012  2:54 PM   Selective Debridement Selective Debridement - Location: R lateral LE Selective Debridement - Tools Used: Forceps;Scalpel Selective Debridement - Tissue Removed: slough and dry skin at perimeter   Physical Therapy Assessment and Plan Wound Therapy - Assess/Plan/Recommendations Wound  Therapy - Clinical Statement: Wound presents with increased dryness at perimeter and increased slough this session. This is most likely due to pt leaving wound open overnight. Silver dressing with hydrogel applied again this session. Vaseline applied to wound perimeter to decreased dryness and protect skin integrity. Pt states pain decrease to 0/10 at end of session. Wound Plan: Continue with wound care per PT POC.       Problem List There is no problem list on file for this patient.    Seth Bake, PTA 09/05/2012, 3:12 PM

## 2012-09-07 ENCOUNTER — Ambulatory Visit (HOSPITAL_COMMUNITY)
Admission: RE | Admit: 2012-09-07 | Discharge: 2012-09-07 | Disposition: A | Discharge status: Home / Self Care | Payer: Self-pay | Source: Ambulatory Visit

## 2012-09-07 NOTE — Progress Notes (Signed)
Physical Therapy - Wound Therapy  Treatment   Patient Details  Name: Rodney Brown MRN: [id number] Date of Birth: 1991-10-18  Today's Date: 09/07/2012 Time: 0865-7846 Time Calculation (min): 21 min  Visit#: 14  of 15   Re-eval: 09/12/12  Subjective Subjective Assessment Subjective: Pt states that he can not keep his bandage from sliding down when he is at work   Pain Assessment Pain Assessment Pain Assessment: No/denies pain Pain Orientation: Right;Lateral  Wound Therapy Wound 07/01/12 Degloving Leg Right;Lateral wound is triangular in shape. (Active)  Site / Wound Assessment Clean 09/07/2012  1:34 PM  % Wound base Red or Granulating 95% 09/07/2012  1:34 PM  % Wound base Yellow 5% 09/07/2012  1:34 PM  % Wound base Black 0% 09/05/2012  2:54 PM  % Wound base Other (Comment) 0% 09/05/2012  2:54 PM  Peri-wound Assessment Intact 09/05/2012  2:54 PM  Wound Length (cm) 4 cm 09/01/2012 10:46 AM  Wound Width (cm) 0.75 cm 09/01/2012 10:46 AM  Wound Depth (cm) 0 cm 09/01/2012 10:46 AM  Margins Attached edges (approximated) 09/05/2012  2:54 PM  Closure None 09/05/2012  2:54 PM  Drainage Amount Scant 09/05/2012  2:54 PM  Drainage Description Serous 09/05/2012  2:54 PM  Non-staged Wound Description Not applicable 09/05/2012  2:54 PM  Treatment Debridement (Selective);Cleansed 09/07/2012  1:34 PM  Dressing Type Hydrogel 09/07/2012  1:34 PM  Dressing Changed Changed 09/07/2012  1:34 PM  Dressing Status Clean 09/07/2012  1:34 PM   Selective Debridement Selective Debridement - Location: entire superior aspect of wound; edges of inferior aspct of wound Selective Debridement - Tools Used: Forceps Selective Debridement - Tissue Removed: slough   Physical Therapy Assessment and Plan Wound Therapy - Assess/Plan/Recommendations Wound Therapy - Clinical Statement: dressing has slide down wound.  There appears a small opening just distal to wound .1 diameter that appears to have some depth but is so small it  is difficult to assess at this time.  Silver dressing no longer needed  as wound is has good granulation with minimal drainage.  Wrap wound to just distal to knee joint to attempt to stop dressing from sliding down. Hydrotherapy Plan: Debridement;Dressing change;Patient/family education Wound Plan: Re-assess wound next treatment will need medicaid re-authorization or possible D/C      Goals    Problem List There is no problem list on file for this patient.   GP    Coline Calkin,CINDY 09/07/2012, 1:41 PM

## 2012-09-09 ENCOUNTER — Ambulatory Visit (HOSPITAL_COMMUNITY)
Admission: RE | Admit: 2012-09-09 | Discharge: 2012-09-09 | Disposition: A | Discharge status: Home / Self Care | Payer: Self-pay | Source: Ambulatory Visit | Attending: Physical Therapy | Admitting: Physical Therapy

## 2012-09-09 NOTE — Progress Notes (Signed)
Physical Therapy - Wound Therapy  Treatment   Patient Details  Name: Rodney Brown MRN: [id number] Date of Birth: Dec 15, 1991  Today's Date: 09/09/2012 Time: 1050-1108 Time Calculation (min): 18 min  Visit#: 15  of 15   Re-eval:   Charge: Selective debridment < 20 cm  Subjective Subjective Assessment Subjective: Pt reports pain free leg, feels like wound is making good progress.  Pain Assessment Pain Assessment Pain Assessment: No/denies pain  Wound Therapy  09/09/12 1050  Subjective Assessment  Subjective Pt reports pain free leg, feels like wound is making good progress.  Wound 07/01/12 Degloving Leg Right;Lateral wound is triangular in shape.  Date First Assessed/Time First Assessed: 07/01/12 1130   Wound Type: Degloving  Location: Leg  Location Orientation: Right;Lateral  Wound Description (Comments): wound is triangular in shape.  Present on Admission: Yes  Site / Wound Assessment Clean  % Wound base Red or Granulating (98)  % Wound base Yellow (2)  Wound Length (cm) 4 cm  Wound Width (cm) 1.75 cm  Wound Depth (cm) 0.1 cm  Treatment Cleansed;Debridement (Selective)  Dressing Type Hydrogel  Dressing Changed New  Dressing Status Clean;Dry;Intact  Selective Debridement  Selective Debridement - Location entire superior aspect of wound; edges of inferior aspct of wound  Selective Debridement - Tools Used Forceps  Selective Debridement - Tissue Removed slough  Wound Therapy - Assess/Plan/Recommendations  Wound Therapy - Clinical Statement Wound improving in overall size with 98% granulation following removal of slough.  Following discussion with initial evaluating PT and pt. decision was made to d/c to home wound care.  Pt instructed proper care for wound and given supplies.   Wound Plan D/C to home wound care      Selective Debridement Selective Debridement - Location: entire superior aspect of wound; edges of inferior aspct of wound Selective Debridement - Tools  Used: Forceps Selective Debridement - Tissue Removed: slough   Physical Therapy Assessment and Plan Wound Therapy - Assess/Plan/Recommendations Wound Therapy - Clinical Statement: Wound improving in overall size with 98% granulation following removal of slough.  Following discussion with initial evaluating PT and pt. decision was made to d/c to home wound care.  Pt instructed proper care for wound and given supplies.  Wound Plan: D/C to home wound care      Goals    Problem List There is no problem list on file for this patient.   GP    Juel Burrow 09/09/2012, 11:40 AM

## 2013-10-04 ENCOUNTER — Emergency Department (HOSPITAL_COMMUNITY)
Admission: EM | Admit: 2013-10-04 | Discharge: 2013-10-04 | Disposition: A | Discharge status: Home / Self Care | Payer: BC Managed Care – PPO | Attending: Emergency Medicine | Admitting: Emergency Medicine

## 2013-10-04 ENCOUNTER — Encounter (HOSPITAL_COMMUNITY): Payer: Self-pay | Admitting: Emergency Medicine

## 2013-10-04 DIAGNOSIS — IMO0002 Reserved for concepts with insufficient information to code with codable children: Secondary | ICD-10-CM | POA: Insufficient documentation

## 2013-10-04 DIAGNOSIS — Y990 Civilian activity done for income or pay: Secondary | ICD-10-CM | POA: Insufficient documentation

## 2013-10-04 DIAGNOSIS — F17200 Nicotine dependence, unspecified, uncomplicated: Secondary | ICD-10-CM | POA: Insufficient documentation

## 2013-10-04 DIAGNOSIS — T148XXA Other injury of unspecified body region, initial encounter: Secondary | ICD-10-CM

## 2013-10-04 DIAGNOSIS — R300 Dysuria: Secondary | ICD-10-CM | POA: Insufficient documentation

## 2013-10-04 DIAGNOSIS — Y9289 Other specified places as the place of occurrence of the external cause: Secondary | ICD-10-CM | POA: Insufficient documentation

## 2013-10-04 DIAGNOSIS — R61 Generalized hyperhidrosis: Secondary | ICD-10-CM | POA: Insufficient documentation

## 2013-10-04 DIAGNOSIS — Y9389 Activity, other specified: Secondary | ICD-10-CM | POA: Insufficient documentation

## 2013-10-04 DIAGNOSIS — X503XXA Overexertion from repetitive movements, initial encounter: Secondary | ICD-10-CM | POA: Insufficient documentation

## 2013-10-04 LAB — URINALYSIS, ROUTINE W REFLEX MICROSCOPIC
Bilirubin Urine: NEGATIVE
Glucose, UA: NEGATIVE mg/dL
Leukocytes, UA: NEGATIVE
Nitrite: NEGATIVE
Protein, ur: NEGATIVE mg/dL
Urobilinogen, UA: 0.2 mg/dL (ref 0.0–1.0)
pH: 6 (ref 5.0–8.0)

## 2013-10-04 LAB — URINE MICROSCOPIC-ADD ON

## 2013-10-04 MED ORDER — IBUPROFEN 800 MG PO TABS: 800.0000 mg | ORAL_TABLET | Freq: Three times a day (TID) | ORAL | Status: DC

## 2013-10-04 NOTE — ED Provider Notes (Signed)
CSN: [id number]     Arrival date & time 10/04/13  8469 History  This chart was scribed for Benny Lennert, MD by Quintella Reichert, ED scribe.  This patient was seen in room APA04/[facility]04 and the patient's care was started at 7:24 AM.   Chief Complaint  Patient presents with  . Abdominal Pain    Patient is a 22 y.o. male presenting with abdominal pain. The history is provided by the patient. No language interpreter was used.  Abdominal Pain Pain location:  Suprapubic Pain quality comment:  "like I have to pee" Pain severity:  Mild Duration:  2 days Chronicity:  New Context comment:  Heavy lifting Exacerbated by: Heavy lifting. Associated symptoms: dysuria   Associated symptoms: no chills, no cough, no diarrhea, no fatigue, no fever, no hematuria, no nausea and no vomiting     HPI Comments: MUNEEB VERAS is a 22 y.o. male who presents to the Emergency Department complaining of 2 days of suprapubic abdominal pain extending from above his testicles to his umbilicus.  Pt describes pain as "it feels like I need to pee even when I don't have to."  Pain is worsened by heavy lifting.  He also states his urine has been very yellow for the past 2 days even though he has been drinking a large amount of fluid.  He also states when he urinates "it's got a feeling to it" and he has "little squirts."  He also complains of night sweats.  He denies penile discharge, fevers, chills, nausea, vomiting, diarrhea, or any other associated symptoms.  He reports that he started a new job 2 days ago which requires a lot of heavy lifting.    History reviewed. No pertinent past medical history.   Past Surgical History  Procedure Laterality Date  . Shoulder surgery      No family history on file.   History  Substance Use Topics  . Smoking status: Current Every Day Smoker  . Smokeless tobacco: Not on file  . Alcohol Use: Yes     Comment: occ     Review of Systems  Constitutional: Negative for fever,  chills, appetite change and fatigue.       Night sweats  HENT: Negative for congestion, ear discharge and sinus pressure.   Eyes: Negative for discharge.  Respiratory: Negative for cough.   Gastrointestinal: Positive for abdominal pain. Negative for nausea, vomiting and diarrhea.  Genitourinary: Positive for dysuria. Negative for frequency, hematuria and discharge.       Dark yellow urine  Musculoskeletal: Negative for back pain.  Skin: Negative for rash.  Neurological: Negative for seizures.  Psychiatric/Behavioral: Negative for hallucinations.     Allergies  Review of patient's allergies indicates no known allergies.  Home Medications   Current Outpatient Rx  Name  Route  Sig  Dispense  Refill  . acetaminophen (TYLENOL) 500 MG tablet   Oral   Take 1,000 mg by mouth every 6 (six) hours as needed. For pain/headache          BP 130/79  Pulse 77  Temp(Src) 98.4 F (36.9 C) (Oral)  Resp 20  Ht 5\' 7"  (1.702 m)  Wt 170 lb (77.111 kg)  BMI 26.62 kg/m2  SpO2 97%  Physical Exam  Nursing note and vitals reviewed. Constitutional: He is oriented to person, place, and time. He appears well-developed.  HENT:  Head: Normocephalic.  Eyes: Conjunctivae and EOM are normal. No scleral icterus.  Neck: Neck supple. No thyromegaly present.  Cardiovascular: Normal rate and regular rhythm.  Exam reveals no gallop and no friction rub.   No murmur heard. Pulmonary/Chest: No stridor. He has no wheezes. He has no rales. He exhibits no tenderness.  Abdominal: Soft. He exhibits no distension and no mass. There is tenderness (minor, suprapubic). There is no rebound.  Musculoskeletal: Normal range of motion. He exhibits no edema.  Lymphadenopathy:    He has no cervical adenopathy.  Neurological: He is oriented to person, place, and time. He exhibits normal muscle tone. Coordination normal.  Skin: No rash noted. No erythema.  Psychiatric: He has a normal mood and affect. His behavior is  normal.    ED Course  Procedures (including critical care time)  DIAGNOSTIC STUDIES: Oxygen Saturation is 97% on room air, normal by my interpretation.    COORDINATION OF CARE: 7:27 AM: Discussed treatment plan which includes UA.  Pt expressed understanding and agreed to plan.  9:05 AM: Informed pt that UA is unremarkable apart from dehydration and that symptoms are likely muscular.  Discussed treatment plan which includes pain medication, rest, and referral for PCP f/u next week if symptoms do not improve.  Pt expressed understanding and agreed to plan.   Labs Review Labs Reviewed  URINALYSIS, ROUTINE W REFLEX MICROSCOPIC - Abnormal; Notable for the following:    Specific Gravity, Urine >1.030 (*)    Hgb urine dipstick SMALL (*)    All other components within normal limits  URINE MICROSCOPIC-ADD ON    Imaging Review No results found.  EKG Interpretation   None       MDM  No diagnosis found.     The chart was scribed for me under my direct supervision.  I personally performed the history, physical, and medical decision making and all procedures in the evaluation of this patient.Benny Lennert, MD 10/04/13 [phone]

## 2013-10-04 NOTE — Progress Notes (Signed)
ED/CM noted patient did not have health insurance and/or PCP listed in the computer.  Patient was given the [place] resource handout with information on the clinics, food pantries, and the handout for new health insurance sign-up.  Patient expressed appreciation for this. 

## 2013-10-04 NOTE — ED Notes (Signed)
Pt c/o pain in lower abd radiating into r testicle since Monday.  Reports started a new job recently and requires a lot of heavy lifting.  Denies any n/v/d.

## 2013-10-04 NOTE — Progress Notes (Deleted)
UR Chart Review Completed  

## 2014-06-08 ENCOUNTER — Other Ambulatory Visit: Payer: Self-pay | Admitting: Orthopedic Surgery

## 2014-06-08 DIAGNOSIS — M25511 Pain in right shoulder: Secondary | ICD-10-CM

## 2014-06-13 ENCOUNTER — Other Ambulatory Visit: Payer: Self-pay

## 2014-06-15 ENCOUNTER — Inpatient Hospital Stay: Admission: RE | Admit: 2014-06-15 | Payer: Self-pay | Source: Ambulatory Visit

## 2014-06-15 ENCOUNTER — Other Ambulatory Visit: Payer: Self-pay | Admitting: Orthopedic Surgery

## 2014-06-15 ENCOUNTER — Ambulatory Visit
Admission: RE | Admit: 2014-06-15 | Discharge: 2014-06-15 | Disposition: A | Discharge status: Home / Self Care | Payer: BC Managed Care – PPO | Source: Ambulatory Visit | Attending: Orthopedic Surgery | Admitting: Orthopedic Surgery

## 2014-06-15 DIAGNOSIS — Z01818 Encounter for other preprocedural examination: Secondary | ICD-10-CM

## 2014-06-15 DIAGNOSIS — M25511 Pain in right shoulder: Secondary | ICD-10-CM

## 2014-06-20 DIAGNOSIS — M24419 Recurrent dislocation, unspecified shoulder: Secondary | ICD-10-CM

## 2014-06-20 HISTORY — DX: Recurrent dislocation, unspecified shoulder: M24.419

## 2014-06-26 ENCOUNTER — Encounter (HOSPITAL_BASED_OUTPATIENT_CLINIC_OR_DEPARTMENT_OTHER): Payer: Self-pay | Admitting: *Deleted

## 2014-06-26 DIAGNOSIS — R21 Rash and other nonspecific skin eruption: Secondary | ICD-10-CM

## 2014-06-26 HISTORY — DX: Rash and other nonspecific skin eruption: R21

## 2014-06-26 NOTE — Pre-Procedure Instructions (Signed)
[facility] [place] [place] - [phone]

## 2014-06-29 ENCOUNTER — Encounter (HOSPITAL_BASED_OUTPATIENT_CLINIC_OR_DEPARTMENT_OTHER)
Admission: RE | Disposition: A | Discharge status: Home / Self Care | Payer: Self-pay | Source: Ambulatory Visit | Attending: Orthopedic Surgery

## 2014-06-29 ENCOUNTER — Ambulatory Visit (HOSPITAL_BASED_OUTPATIENT_CLINIC_OR_DEPARTMENT_OTHER)
Admission: RE | Admit: 2014-06-29 | Discharge: 2014-06-29 | Disposition: A | Discharge status: Home / Self Care | Payer: BC Managed Care – PPO | Source: Ambulatory Visit | Attending: Orthopedic Surgery | Admitting: Orthopedic Surgery

## 2014-06-29 ENCOUNTER — Encounter (HOSPITAL_BASED_OUTPATIENT_CLINIC_OR_DEPARTMENT_OTHER): Payer: Self-pay | Admitting: Anesthesiology

## 2014-06-29 ENCOUNTER — Ambulatory Visit (HOSPITAL_BASED_OUTPATIENT_CLINIC_OR_DEPARTMENT_OTHER): Payer: BC Managed Care – PPO | Admitting: Anesthesiology

## 2014-06-29 ENCOUNTER — Encounter (HOSPITAL_BASED_OUTPATIENT_CLINIC_OR_DEPARTMENT_OTHER): Payer: BC Managed Care – PPO | Admitting: Anesthesiology

## 2014-06-29 DIAGNOSIS — M24419 Recurrent dislocation, unspecified shoulder: Secondary | ICD-10-CM | POA: Diagnosis present

## 2014-06-29 DIAGNOSIS — M24411 Recurrent dislocation, right shoulder: Secondary | ICD-10-CM

## 2014-06-29 DIAGNOSIS — F17200 Nicotine dependence, unspecified, uncomplicated: Secondary | ICD-10-CM | POA: Diagnosis not present

## 2014-06-29 HISTORY — DX: Recurrent dislocation, right shoulder: M24.411

## 2014-06-29 HISTORY — DX: Rash and other nonspecific skin eruption: R21

## 2014-06-29 HISTORY — DX: Recurrent dislocation, unspecified shoulder: M24.419

## 2014-06-29 LAB — POCT HEMOGLOBIN-HEMACUE: Hemoglobin: 16.8 g/dL (ref 13.0–17.0)

## 2014-06-29 SURGERY — REPAIR, SHOULDER, LATARJET
Anesthesia: General | Site: Shoulder | Laterality: Right

## 2014-06-29 MED ORDER — OXYCODONE HCL 5 MG/5ML PO SOLN: 5.0000 mg | Freq: Once | ORAL | Status: DC | PRN

## 2014-06-29 MED ORDER — METHOCARBAMOL 500 MG PO TABS: 500.0000 mg | ORAL_TABLET | Freq: Four times a day (QID) | ORAL | Status: DC

## 2014-06-29 MED ORDER — LACTATED RINGERS IV SOLN
INTRAVENOUS | Status: DC
Start: 2014-06-29 — End: 2014-06-29
  Administered 2014-06-29 (×3): via INTRAVENOUS

## 2014-06-29 MED ORDER — MIDAZOLAM HCL 2 MG/ML PO SYRP: 12.0000 mg | ORAL_SOLUTION | Freq: Once | ORAL | Status: DC | PRN

## 2014-06-29 MED ORDER — FENTANYL CITRATE 0.05 MG/ML IJ SOLN
INTRAMUSCULAR | Status: DC | PRN
  Administered 2014-06-29: 25 ug via INTRAVENOUS

## 2014-06-29 MED ORDER — PROMETHAZINE HCL 25 MG/ML IJ SOLN: 6.2500 mg | INTRAMUSCULAR | Status: DC | PRN

## 2014-06-29 MED ORDER — FENTANYL CITRATE 0.05 MG/ML IJ SOLN
INTRAMUSCULAR | Status: AC
  Filled 2014-06-29: qty 2

## 2014-06-29 MED ORDER — MIDAZOLAM HCL 2 MG/2ML IJ SOLN
1.0000 mg | INTRAMUSCULAR | Status: DC | PRN
  Administered 2014-06-29: 2 mg via INTRAVENOUS

## 2014-06-29 MED ORDER — DEXAMETHASONE SODIUM PHOSPHATE 4 MG/ML IJ SOLN
INTRAMUSCULAR | Status: DC | PRN
  Administered 2014-06-29: 10 mg via INTRAVENOUS

## 2014-06-29 MED ORDER — SENNA-DOCUSATE SODIUM 8.6-50 MG PO TABS: 2.0000 | ORAL_TABLET | Freq: Every day | ORAL | Status: DC

## 2014-06-29 MED ORDER — PROPOFOL 10 MG/ML IV BOLUS
INTRAVENOUS | Status: DC | PRN
  Administered 2014-06-29: 200 mg via INTRAVENOUS

## 2014-06-29 MED ORDER — MIDAZOLAM HCL 2 MG/2ML IJ SOLN
INTRAMUSCULAR | Status: AC
  Filled 2014-06-29: qty 2

## 2014-06-29 MED ORDER — OXYCODONE-ACETAMINOPHEN 10-325 MG PO TABS: 1.0000 | ORAL_TABLET | Freq: Four times a day (QID) | ORAL | Status: DC | PRN

## 2014-06-29 MED ORDER — CEFAZOLIN SODIUM-DEXTROSE 2-3 GM-% IV SOLR
INTRAVENOUS | Status: AC
  Filled 2014-06-29: qty 50

## 2014-06-29 MED ORDER — CEFAZOLIN SODIUM-DEXTROSE 2-3 GM-% IV SOLR
2.0000 g | INTRAVENOUS | Status: AC
  Administered 2014-06-29: 2 g via INTRAVENOUS

## 2014-06-29 MED ORDER — CEFAZOLIN SODIUM-DEXTROSE 2-3 GM-% IV SOLR
INTRAVENOUS | Status: AC
Start: 2014-06-29 — End: 2014-06-29
  Filled 2014-06-29: qty 50

## 2014-06-29 MED ORDER — PROPOFOL 10 MG/ML IV BOLUS
INTRAVENOUS | Status: AC
  Filled 2014-06-29: qty 40

## 2014-06-29 MED ORDER — FENTANYL CITRATE 0.05 MG/ML IJ SOLN
50.0000 ug | INTRAMUSCULAR | Status: DC | PRN
  Administered 2014-06-29: 100 ug via INTRAVENOUS

## 2014-06-29 MED ORDER — BUPIVACAINE-EPINEPHRINE (PF) 0.5% -1:200000 IJ SOLN
INTRAMUSCULAR | Status: DC | PRN
  Administered 2014-06-29: 150 mL via PERINEURAL

## 2014-06-29 MED ORDER — SUCCINYLCHOLINE CHLORIDE 20 MG/ML IJ SOLN
INTRAMUSCULAR | Status: DC | PRN
  Administered 2014-06-29: 100 mg via INTRAVENOUS

## 2014-06-29 MED ORDER — LIDOCAINE HCL 4 % MT SOLN
OROMUCOSAL | Status: DC | PRN
  Administered 2014-06-29: 3 mL via TOPICAL

## 2014-06-29 MED ORDER — PROMETHAZINE HCL 25 MG PO TABS: 25.0000 mg | ORAL_TABLET | Freq: Four times a day (QID) | ORAL | Status: DC | PRN

## 2014-06-29 MED ORDER — ONDANSETRON HCL 4 MG/2ML IJ SOLN
INTRAMUSCULAR | Status: DC | PRN
  Administered 2014-06-29: 4 mg via INTRAVENOUS

## 2014-06-29 MED ORDER — OXYCODONE HCL 5 MG PO TABS: 5.0000 mg | ORAL_TABLET | Freq: Once | ORAL | Status: DC | PRN

## 2014-06-29 MED ORDER — HYDROMORPHONE HCL PF 1 MG/ML IJ SOLN
INTRAMUSCULAR | Status: AC
  Filled 2014-06-29: qty 1

## 2014-06-29 MED ORDER — HYDROMORPHONE HCL PF 1 MG/ML IJ SOLN
0.2500 mg | INTRAMUSCULAR | Status: DC | PRN
  Administered 2014-06-29 (×2): 0.5 mg via INTRAVENOUS

## 2014-06-29 SURGICAL SUPPLY — 94 items
BENZOIN TINCTURE PRP APPL 2/3 (GAUZE/BANDAGES/DRESSINGS) ×3 IMPLANT
BIT DRILL 2.75 .066 CANNLTION (DRILL) ×1 IMPLANT
BIT DRILL NON CANNULATED 4MM (DRILL) ×1 IMPLANT
BLADE AVERAGE 25MMX9MM (BLADE)
BLADE AVERAGE 25X9 (BLADE) IMPLANT
BLADE CUTTER GATOR 3.5 (BLADE) IMPLANT
BLADE GREAT WHITE 4.2 (BLADE) IMPLANT
BLADE GREAT WHITE 4.2MM (BLADE)
BLADE SAW SAG 19X10X0.6 (BLADE) ×2 IMPLANT
BLADE SAW SAG 19X10X0.6MM (BLADE) ×1
BLADE SURG 15 STRL LF DISP TIS (BLADE) ×3 IMPLANT
BLADE SURG 15 STRL SS (BLADE) ×9
BLADE SURG ROTATE 9660 (MISCELLANEOUS) IMPLANT
BUR EGG 3PK/BX (BURR) ×3 IMPLANT
CANISTER SUCT 3000ML (MISCELLANEOUS) IMPLANT
CANNULA 5.75X71 LONG (CANNULA) IMPLANT
CANNULA TWIST IN 8.25X7CM (CANNULA) IMPLANT
CANNULA TWIST IN 8.25X9CM (CANNULA) IMPLANT
CHLORAPREP W/TINT 26ML (MISCELLANEOUS) ×3 IMPLANT
CLOSURE WOUND 1/2 X4 (GAUZE/BANDAGES/DRESSINGS) ×1
DECANTER SPIKE VIAL GLASS SM (MISCELLANEOUS) IMPLANT
DRAPE C-ARM 42X72 X-RAY (DRAPES) IMPLANT
DRAPE INCISE IOBAN 66X45 STRL (DRAPES) ×3 IMPLANT
DRAPE OEC MINIVIEW 54X84 (DRAPES) ×3 IMPLANT
DRAPE SHOULDER BEACH CHAIR (DRAPES) IMPLANT
DRAPE U 20/CS (DRAPES) ×3 IMPLANT
DRAPE U-SHAPE 47X51 STRL (DRAPES) ×6 IMPLANT
DRAPE U-SHAPE 76X120 STRL (DRAPES) IMPLANT
DRILL 2.75 .066 CANNULATION (DRILL) ×3
DRILL NON CANNULATED 4MM (DRILL) ×3
DRSG PAD ABDOMINAL 8X10 ST (GAUZE/BANDAGES/DRESSINGS) ×3 IMPLANT
ELECT BLADE 6.5 .24CM SHAFT (ELECTRODE) IMPLANT
ELECT REM PT RETURN 9FT ADLT (ELECTROSURGICAL) ×3
ELECTRODE REM PT RTRN 9FT ADLT (ELECTROSURGICAL) ×1 IMPLANT
FIBERSTICK 2 (SUTURE) ×3 IMPLANT
GAUZE SPONGE 4X4 12PLY STRL (GAUZE/BANDAGES/DRESSINGS) ×3 IMPLANT
GAUZE SPONGE 4X4 16PLY XRAY LF (GAUZE/BANDAGES/DRESSINGS) IMPLANT
GLOVE BIO SURGEON STRL SZ 6.5 (GLOVE) ×2 IMPLANT
GLOVE BIO SURGEON STRL SZ8 (GLOVE) ×3 IMPLANT
GLOVE BIO SURGEONS STRL SZ 6.5 (GLOVE) ×1
GLOVE BIOGEL M STRL SZ7.5 (GLOVE) ×3 IMPLANT
GLOVE BIOGEL PI IND STRL 7.0 (GLOVE) ×1 IMPLANT
GLOVE BIOGEL PI IND STRL 8 (GLOVE) ×2 IMPLANT
GLOVE BIOGEL PI INDICATOR 7.0 (GLOVE) ×2
GLOVE BIOGEL PI INDICATOR 8 (GLOVE) ×4
GLOVE ORTHO TXT STRL SZ7.5 (GLOVE) ×3 IMPLANT
GOWN STRL REUS W/ TWL LRG LVL3 (GOWN DISPOSABLE) ×1 IMPLANT
GOWN STRL REUS W/ TWL XL LVL3 (GOWN DISPOSABLE) ×2 IMPLANT
GOWN STRL REUS W/TWL LRG LVL3 (GOWN DISPOSABLE) ×3
GOWN STRL REUS W/TWL XL LVL3 (GOWN DISPOSABLE) ×6
GUIDEWIRE .062X6IN LONG (WIRE) ×3 IMPLANT
GUIDEWIRE .062X7IN LONG (WIRE) ×3 IMPLANT
K-WIRE ×6 IMPLANT
KIT BIO-TENODESIS 3X8 DISP (MISCELLANEOUS) ×2
KIT INSRT BABSR STRL DISP BTN (MISCELLANEOUS) ×1 IMPLANT
KIT SHOULDER TRACTION (DRAPES) ×3 IMPLANT
NS IRRIG 1000ML POUR BTL (IV SOLUTION) ×3 IMPLANT
PACK ARTHROSCOPY DSU (CUSTOM PROCEDURE TRAY) ×3 IMPLANT
PACK BASIN DAY SURGERY FS (CUSTOM PROCEDURE TRAY) ×3 IMPLANT
PASSER SUT SWANSON 36MM LOOP (INSTRUMENTS) IMPLANT
PENCIL BUTTON HOLSTER BLD 10FT (ELECTRODE) ×3 IMPLANT
SCREW CANN 3.75X38MM FT (Screw) ×3 IMPLANT
SCREW CANNULATED 3.75X36MM FT (Screw) ×3 IMPLANT
SHEET MEDIUM DRAPE 40X70 STRL (DRAPES) IMPLANT
SLEEVE SCD COMPRESS KNEE MED (MISCELLANEOUS) ×3 IMPLANT
SLING ARM IMMOBILIZER MED (SOFTGOODS) IMPLANT
SLING ARM LRG ADULT FOAM STRAP (SOFTGOODS) IMPLANT
SLING ARM MED ADULT FOAM STRAP (SOFTGOODS) IMPLANT
SLING ARM XL FOAM STRAP (SOFTGOODS) IMPLANT
SPONGE LAP 4X18 X RAY DECT (DISPOSABLE) ×6 IMPLANT
STRIP CLOSURE SKIN 1/2X4 (GAUZE/BANDAGES/DRESSINGS) ×2 IMPLANT
SUCTION FRAZIER TIP 10 FR DISP (SUCTIONS) ×3 IMPLANT
SUPPORT WRAP ARM LG (MISCELLANEOUS) ×3 IMPLANT
SUT 2 FIBERLOOP 20 STRT BLUE (SUTURE) ×3
SUT ETHIBOND 2 OS 4 DA (SUTURE) IMPLANT
SUT ETHILON 4 0 PS 2 18 (SUTURE) IMPLANT
SUT FIBERWIRE #2 38 T-5 BLUE (SUTURE) ×3
SUT MNCRL AB 3-0 PS2 18 (SUTURE) IMPLANT
SUT MNCRL AB 4-0 PS2 18 (SUTURE) ×3 IMPLANT
SUT PDS AB 0 CT 36 (SUTURE) IMPLANT
SUT PDS AB 1 CT  36 (SUTURE)
SUT PDS AB 1 CT 36 (SUTURE) IMPLANT
SUT PROLENE 3 0 PS 2 (SUTURE) IMPLANT
SUT VIC AB 0 CT1 18XCR BRD 8 (SUTURE) IMPLANT
SUT VIC AB 0 CT1 8-18 (SUTURE)
SUT VIC AB 2-0 SH 18 (SUTURE) IMPLANT
SUT VICRYL 3-0 CR8 SH (SUTURE) ×6 IMPLANT
SUTURE 2 FIBERLOOP 20 STRT BLU (SUTURE) ×1 IMPLANT
SUTURE FIBERWR #2 38 T-5 BLUE (SUTURE) ×1 IMPLANT
SYR BULB 3OZ (MISCELLANEOUS) ×3 IMPLANT
TAPE FIBER 2MM 7IN #2 BLUE (SUTURE) IMPLANT
TOWEL OR 17X24 6PK STRL BLUE (TOWEL DISPOSABLE) ×3 IMPLANT
TOWEL OR NON WOVEN STRL DISP B (DISPOSABLE) ×3 IMPLANT
YANKAUER SUCT BULB TIP NO VENT (SUCTIONS) ×3 IMPLANT

## 2014-06-29 NOTE — H&P (Signed)
PREOPERATIVE H&Brown  Chief Complaint: RIGHT SHOULDER DISLOCATION/SUBLUXATION, RECURRENT  HPI: Rodney Brown is a 23 y.o. male who presents for preoperative history and physical with a diagnosis of RIGHT SHOULDER DISLOCATION/SUBLUXATION, RECURRENT. Symptoms are rated as moderate to severe, and have been worsening.  This is significantly impairing activities of daily living.  He has elected for surgical management. He has had 2 previous arthroscopic surgeries, and continues to have symptoms that of instability. At his second surgery was recognized he had a fairly substantial amount bone loss which has been confirmed by a recent CT scan. He has failed conservative measures as well as previous surgical measures and requires bone reconstruction.  Past Medical History  Diagnosis Date  . 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 Rash of neck 06/26/2014   Past Surgical History  Procedure Laterality Date  . Shoulder surgery Right     x 2   History   Social History  . Marital Status: Single    Spouse Name: N/A    Number of Children: N/A  . Years of Education: N/A   Social History Main Topics  . Smoking status: Current Every Day Smoker -- 0.50 packs/day for 4 years    Types: Cigarettes  . Smokeless tobacco: Never Used  . Alcohol Use: Yes     Comment: occasionally  . Drug Use: No  . Sexual Activity: None   Other Topics Concern  . None   Social History Narrative  . None   History reviewed. No pertinent family history. No Known Allergies Prior to Admission medications   Not on File     Positive ROS: All other systems have been reviewed and were otherwise negative with the exception of those mentioned in the HPI and as above.  Physical Exam: General: Alert, no acute distress Cardiovascular: No pedal edema Respiratory: No cyanosis, no use of accessory musculature GI: No organomegaly, abdomen is soft and non-tender Skin: No lesions in the area of chief complaint Neurologic:  Sensation intact distally Psychiatric: Patient is competent for consent with normal mood and affect Lymphatic: No axillary or cervical lymphadenopathy  MUSCULOSKELETAL: Right shoulder his active motion 0-165, surgical wounds have healed well, sensation intact distally. Positive apprehension.   Assessment: RIGHT SHOULDER DISLOCATION/SUBLUXATION, RECURRENT  Plan: Plan for Procedure(s): RIGHT SHOULDER CAPSULORRHAPHY ANTERIOR ANY TYPE WITH CORACOID PROCESS TRANSFER (LATARJET)  The risks benefits and alternatives were discussed with the patient including but not limited to the risks of nonoperative treatment, versus surgical intervention including infection, bleeding, nerve injury,  blood clots, cardiopulmonary complications, morbidity, mortality, among others, and they were willing to proceed. We've also discussed the risks for recurrent instability, posttraumatic arthritis, graft nonunion, malunion, among others.  Rodney Brown,Rodney Mawhinney P, MD Cell [phone]   06/29/2014 7:32 AM

## 2014-06-29 NOTE — Anesthesia Postprocedure Evaluation (Signed)
Anesthesia Post Note  Patient: Rodney Brown  Procedure(s) Performed: Procedure(s) (LRB): RIGHT SHOULDER CAPSULORRHAPHY ANTERIOR ANY TYPE WITH CORACOID PROCESS TRANSFER (LATARJET) (Right)  Anesthesia type: general  Patient location: PACU  Post pain: Pain level controlled  Post assessment: Patient's Cardiovascular Status Stable  Last Vitals:  Filed Vitals:   06/29/14 1400  BP: 136/80  Pulse:   Temp:   Resp:     Post vital signs: Reviewed and stable  Level of consciousness: sedated  Complications: No apparent anesthesia complications

## 2014-06-29 NOTE — Discharge Instructions (Signed)
Diet: As you were doing prior to hospitalization  ° °Shower:  May shower but keep the wounds dry, use an occlusive plastic wrap, NO SOAKING IN TUB.  If the bandage gets wet, change with a clean dry gauze. ° °Dressing:  You may change your dressing 3-5 days after surgery.  Then change the dressing daily with sterile gauze dressing.   ° °There are sticky tapes (steri-strips) on your wounds and all the stitches are absorbable.  Leave the steri-strips in place when changing your dressings, they will peel off with time, usually 2-3 weeks. ° °Activity:  Increase activity slowly as tolerated, but follow the weight bearing instructions below.  No lifting or driving for 6 weeks. ° °Weight Bearing:   Sling at all times.   ° °To prevent constipation: you may use a stool softener such as - ° °Colace (over the counter) 100 mg by mouth twice a day  °Drink plenty of fluids (prune juice may be helpful) and high fiber foods °Miralax (over the counter) for constipation as needed.   ° °Itching:  If you experience itching with your medications, try taking only a single pain pill, or even half a pain pill at a time.  You may take up to 10 pain pills per day, and you can also use benadryl over the counter for itching or also to help with sleep.  ° °Precautions:  If you experience chest pain or shortness of breath - call 911 immediately for transfer to the hospital emergency department!! ° °If you develop a fever greater that 101 F, purulent drainage from wound, increased redness or drainage from wound, or calf pain -- Call the office at [phone]                                                °Follow- Up Appointment:  Please call for an appointment to be seen in 2 weeks [place] - [phone] ° ° ° ° °Regional Anesthesia Blocks ° °1. Numbness or the inability to move the "blocked" extremity may last from 3-48 hours after placement. The length of time depends on the medication injected and your individual response to the  medication. If the numbness is not going away after 48 hours, call your surgeon. ° °2. The extremity that is blocked will need to be protected until the numbness is gone and the  Strength has returned. Because you cannot feel it, you will need to take extra care to avoid injury. Because it may be weak, you may have difficulty moving it or using it. You may not know what position it is in without looking at it while the block is in effect. ° °3. For blocks in the legs and feet, returning to weight bearing and walking needs to be done carefully. You will need to wait until the numbness is entirely gone and the strength has returned. You should be able to move your leg and foot normally before you try and bear weight or walk. You will need someone to be with you when you first try to ensure you do not fall and possibly risk injury. ° °4. Bruising and tenderness at the needle site are common side effects and will resolve in a few days. ° °5. Persistent numbness or new problems with movement should be communicated to the surgeon or the [facility] ([phone])/ [facility] ([phone]). ° ° ° °Post Anesthesia Home Care Instructions ° °Activity: °Get plenty of rest for the remainder of the day. A responsible adult should stay with you for 24 hours following the procedure.  °For the next 24 hours, DO NOT: °-Drive a car °-Operate machinery °-Drink alcoholic beverages °-Take any medication unless instructed by your physician °-Make any legal decisions or sign important papers. ° °Meals: °Start with liquid foods such as gelatin or soup. Progress to regular foods as tolerated. Avoid greasy, spicy, heavy foods. If nausea and/or vomiting occur, drink only clear liquids until the nausea and/or vomiting subsides. Call your physician if vomiting continues. ° °Special Instructions/Symptoms: °Your throat may feel dry or sore from the anesthesia or the breathing tube placed in your throat during surgery.  If this causes discomfort, gargle with warm salt water. The discomfort should disappear within 24 hours. ° °

## 2014-06-29 NOTE — Transfer of Care (Signed)
Immediate Anesthesia Transfer of Care Note  Patient: Rodney Brown  Procedure(s) Performed: Procedure(s) with comments: RIGHT SHOULDER CAPSULORRHAPHY ANTERIOR ANY TYPE WITH CORACOID PROCESS TRANSFER (LATARJET) (Right) - ANESTHESIA: GENERAL, PRE/POST OP SCALENE  Patient Location: PACU  Anesthesia Type:GA combined with regional for post-op pain  Level of Consciousness: awake, alert  and oriented  Airway & Oxygen Therapy: Patient Spontanous Breathing and Patient connected to face mask oxygen  Post-op Assessment: Report given to PACU RN and Post -op Vital signs reviewed and stable  Post vital signs: Reviewed and stable  Complications: No apparent anesthesia complications

## 2014-06-29 NOTE — Op Note (Signed)
06/29/2014  12:11 PM  PATIENT:  Rodney Brown    PRE-OPERATIVE DIAGNOSIS:  RIGHT SHOULDER DISLOCATION/SUBLUXATION, RECURRENT  POST-OPERATIVE DIAGNOSIS:  Same  PROCEDURE:  RIGHT SHOULDER CORACOID PROCESS TRANSFER (LATARJET)  SURGEON:  [name],Keshav Winegar P, MD  PHYSICIAN ASSISTANT: Janace LittenBrandon Parry, OPA-C, present and scrubbed throughout the case, critical for completion in a timely fashion, and for retraction, instrumentation, and closure.  ANESTHESIA:   General  PREOPERATIVE INDICATIONS:  [name] is a  23 y.o. male with a diagnosis of RIGHT SHOULDER DISLOCATION/SUBLUXATION, RECURRENT who failed conservative measures and elected for surgical management.  He has had a total of 2 previous arthroscopies, and had substantial anterior bone loss elected for surgical management.  The risks benefits and alternatives were discussed with the patient preoperatively including but not limited to the risks of infection, bleeding, nerve injury, cardiopulmonary complications, the need for revision surgery, among others, and the patient was willing to proceed.  OPERATIVE IMPLANTS: Arthrex 4.0 mm fully threaded cannulated screws x2, one was a size 38 and the other a size 36.  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  OPERATIVE PROCEDURE: Patient was brought to the operating room and placed in supine position. General anesthesia was administered. IV antibiotics were given. He was then placed in the beach chair position. The right upper extremity was examined and was asked to sitting dislocated prior to the examination. The upper extremity was prepped and draped in usual sterile fashion. Time out was performed.  Deltopectoral approach was carried out, and the coracoid was exposed, and CA ligament released off of the acromion, and the pectoralis minor released medially, and I exposed the medial and lateral borders of the coracoid, and the inferior base, and then used a right angle  90 saw to harvest the coracoid graft. I had excellent graft length, with a substantial bike inferiorly that demonstrated didn't go down to the base of the coracoid.  I then was able to mobilize the graft, taking care to protect the musculocutaneous nerve.  I then placed deep retractors, and the and performed a subscapularis release splitting the upper two thirds, leaving the lower one third intact. I mobilized the subscapularis tagging it with FiberWire, and placed deep retractors including a Fukuda, and then anterior double-pronged in a wing retractor, removed the remainder of the labrum, as well as anyhow the remainder of the previous anchors I cleared off the bed of the glenoid using a bur, to a flat prepared stay, and also prepared the graft with a bur, placing the medial border of the graft on the bed of the donor site, and placing the inferior border in the location of the articular surface. The contour was appropriate.  I had also drilled holes from the medial to lateral orientation through the graft using the appropriate jig, and had predrilled them in order to achieve lag type fixation.  I then applied the jig to the graft, and reduce the graft into its bed against the glenoid, taking care that the tip was directly inferior at the bottom of the glenoid.  I then placed K wires across the glenoid, securing the graft in place provisionally, drilled over with a cannulated drill, and then placed the screws.  Excellent fixation and compression of the graft again bone was achieved, and contour was appropriate, final C-arm pictures were taken which demonstrated a a smooth position between the graft and the articular surface, and appropriate position of the skin. Axillary view was not able to obtain.  The wounds were irrigated copiously, and the subscapularis was repaired with a total of 4 #2 FiberWire, one superiorly and then 3 laterally, excellent fixation was achieved. The shoulder was then  stabilized. The wounds were irrigated copiously, and repaired with subcutaneous Vicryl followed by Steri-Strips and sterile gauze. He was awakened and returned back in stable and satisfactory condition. There no complications and he tolerated the procedure well.

## 2014-06-29 NOTE — Anesthesia Procedure Notes (Addendum)
Anesthesia Regional Block:  Interscalene brachial plexus block  Pre-Anesthetic Checklist: ,, timeout performed, Correct Patient, Correct Site, Correct Laterality, Correct Procedure, Correct Position, site marked, Risks and benefits discussed,  Surgical consent,  Pre-op evaluation,  At surgeon's request and post-op pain management  Laterality: Right  Prep: chloraprep       Needles:  Injection technique: Single-shot  Needle Type: Echogenic Stimulator Needle     Needle Length: 5cm 5 cm Needle Gauge: 22 and 22 G    Additional Needles:  Procedures: ultrasound guided (picture in chart) and nerve stimulator Interscalene brachial plexus block  Nerve Stimulator or Paresthesia:  Response: bicep contraction, 0.45 mA,   Additional Responses:   Narrative:  Start time: 06/29/2014 7:46 AM End time: 06/29/2014 7:56 AM Injection made incrementally with aspirations every 5 mL.  Performed by: Personally  Anesthesiologist: J. Adonis Hugueninan Singer, MD  Additional Notes: Functioning IV was confirmed and monitors applied.  A 50mm 22ga echogenic arrow stimulator was used. Sterile prep and drape,hand hygiene and sterile gloves were used.Ultrasound guidance: relevant anatomy identified, needle position confirmed, local anesthetic spread visualized around nerve(s)., vascular puncture avoided.  Image printed for medical record.  Negative aspiration and negative test dose prior to incremental administration of local anesthetic. The patient tolerated the procedure well.   Procedure Name: Intubation Date/Time: 06/29/2014 8:43 AM Performed by: Burna CashONRAD, Chirag Krueger C Pre-anesthesia Checklist: Patient identified, Emergency Drugs available, Suction available and Patient being monitored Patient Re-evaluated:Patient Re-evaluated prior to inductionOxygen Delivery Method: Circle System Utilized Preoxygenation: Pre-oxygenation with 100% oxygen Intubation Type: IV induction Ventilation: Mask ventilation without  difficulty Laryngoscope Size: Mac and 3 Grade View: Grade I Tube type: Oral Tube size: 8.0 mm Number of attempts: 1 Airway Equipment and Method: stylet and oral airway Placement Confirmation: ETT inserted through vocal cords under direct vision,  positive ETCO2 and breath sounds checked- equal and bilateral Secured at: 22 cm Tube secured with: Tape Dental Injury: Teeth and Oropharynx as per pre-operative assessment

## 2014-06-29 NOTE — Anesthesia Preprocedure Evaluation (Addendum)
Anesthesia Evaluation  Patient identified by MRN, date of birth, ID band Patient awake    Reviewed: Allergy & Precautions, H&P , NPO status , Patient's Chart, lab work & pertinent test results  Airway Mallampati: II TM Distance: >3 FB Neck ROM: Full    Dental  (+) Teeth Intact, Dental Advisory Given   Pulmonary Current Smoker,    Pulmonary exam normal       Cardiovascular negative cardio ROS      Neuro/Psych negative neurological ROS  negative psych ROS   GI/Hepatic negative GI ROS, Neg liver ROS,   Endo/Other  negative endocrine ROS  Renal/GU negative Renal ROS     Musculoskeletal   Abdominal   Peds  Hematology   Anesthesia Other Findings   Reproductive/Obstetrics negative OB ROS                          Anesthesia Physical Anesthesia Plan  ASA: II  Anesthesia Plan: General   Post-op Pain Management:    Induction:   Airway Management Planned: Oral ETT  Additional Equipment:   Intra-op Plan:   Post-operative Plan: Extubation in OR  Informed Consent: I have reviewed the patients History and Physical, chart, labs and discussed the procedure including the risks, benefits and alternatives for the proposed anesthesia with the patient or authorized representative who has indicated his/her understanding and acceptance.   Dental advisory given  Plan Discussed with: CRNA, Anesthesiologist and Surgeon  Anesthesia Plan Comments:        Anesthesia Quick Evaluation

## 2014-06-29 NOTE — Progress Notes (Signed)
Assisted Dr. Singer with right, ultrasound guided, interscalene  block. Side rails up, monitors on throughout procedure. See vital signs in flow sheet. Tolerated Procedure well. 

## 2014-08-20 ENCOUNTER — Ambulatory Visit (HOSPITAL_COMMUNITY): Admission: RE | Admit: 2014-08-20 | Payer: MEDICARE | Source: Ambulatory Visit

## 2014-08-30 ENCOUNTER — Ambulatory Visit (HOSPITAL_COMMUNITY)
Admission: RE | Admit: 2014-08-30 | Discharge: 2014-08-30 | Disposition: A | Discharge status: Home / Self Care | Payer: BC Managed Care – PPO | Source: Ambulatory Visit | Attending: Orthopedic Surgery | Admitting: Orthopedic Surgery

## 2014-08-30 DIAGNOSIS — M25511 Pain in right shoulder: Secondary | ICD-10-CM

## 2014-08-30 DIAGNOSIS — M25519 Pain in unspecified shoulder: Secondary | ICD-10-CM | POA: Insufficient documentation

## 2014-08-30 DIAGNOSIS — IMO0001 Reserved for inherently not codable concepts without codable children: Secondary | ICD-10-CM | POA: Diagnosis present

## 2014-08-30 DIAGNOSIS — M6281 Muscle weakness (generalized): Secondary | ICD-10-CM

## 2014-08-30 DIAGNOSIS — M25611 Stiffness of right shoulder, not elsewhere classified: Secondary | ICD-10-CM

## 2014-08-30 DIAGNOSIS — M25619 Stiffness of unspecified shoulder, not elsewhere classified: Secondary | ICD-10-CM | POA: Insufficient documentation

## 2014-08-30 HISTORY — DX: Stiffness of right shoulder, not elsewhere classified: M25.611
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
## 2014-08-30 NOTE — Evaluation (Signed)
Note reviewed by clinical instructor and accurately reflects treatment session.  Anabia Weatherwax, OTR/L,CBIS   

## 2014-09-04 ENCOUNTER — Ambulatory Visit (HOSPITAL_COMMUNITY)
Admission: RE | Admit: 2014-09-04 | Discharge: 2014-09-04 | Disposition: A | Discharge status: Home / Self Care | Payer: BC Managed Care – PPO | Source: Ambulatory Visit | Attending: Orthopedic Surgery | Admitting: Orthopedic Surgery

## 2014-09-04 DIAGNOSIS — IMO0001 Reserved for inherently not codable concepts without codable children: Secondary | ICD-10-CM | POA: Diagnosis not present

## 2014-09-04 NOTE — Progress Notes (Signed)
Occupational Therapy Treatment Patient Details  Name: Rodney Brown MRN: [id number] Date of Birth: 10/17/1991  Today's Date: 09/04/2014 Time: 1108-1145 OT Time Calculation (min): 37 min Reassess: 1108-1110 2' MFR: 1110-1126 16' Therex: 1126-1145 19'  Visit#: 2 of 8  Re-eval: 09/27/14    Authorization: [employer] Medicare OOS  Authorization Time Period: before 10th visit  Authorization Visit#: 2 of 10  Subjective Symptoms/Limitations Symptoms: S: I've really been able to feel it, it feels like it's popping and shifting.   Pain Assessment Currently in Pain?: No/denies  Precautions/Restrictions  Precautions Precautions: None Precaution Comments: progress as tolerated  Exercise/Treatments Supine Protraction: PROM;10 reps Horizontal ABduction: PROM;10 reps External Rotation: PROM;10 reps Internal Rotation: PROM;10 reps Flexion: PROM;10 reps ABduction: PROM;10 reps Seated Elevation: AROM;10 reps Extension: AROM;10 reps Row: AROM;10 reps   Therapy Ball Flexion: 10 reps ABduction: 10 reps       Manual Therapy Manual Therapy: Myofascial release Myofascial Release: Myofascial release and manual stretching to right upper arm, shoulder, trapezius, and scapularis regions to decrease pain and fascial restrictions and improve mobility a pain free zone  Occupational Therapy Assessment and Plan OT Assessment and Plan Clinical Impression Statement: A: Initiated MFR, PROM, AROM exercises.  Patient tolerated well. Patient reported a pinching pain in his posterior shoulder during PROM abduction and AROM abduction using therapy ball.  Scapular position measured from posterior acromion to vertebrae/midline: left 19cm, right 18.5 cm. OT Plan: P: Add pro/ret/ele/dep exercise. Attempt isometrics if enough time.    Goals Short Term Goals Time to Complete Short Term Goals: 4 weeks Short Term Goal 1: Patient will be educated on HEP.  Short Term Goal 1 Progress: Progressing toward  goal Short Term Goal 2: Patient will increase AROM to WFL to increase ability to reach items above shoulder height.  Short Term Goal 2 Progress: Progressing toward goal Short Term Goal 3: Patient will decrease pain to 5/10 while completing daily activities.  Short Term Goal 3 Progress: Progressing toward goal Short Term Goal 4: Patient will decrease fascial restrictions from max to mod amount.   Short Term Goal 4 Progress: Progressing toward goal Short Term Goal 5: Patient will increase strength to 3/5 to increase ability to use right arm during work tasks, including reaching and lifting objects.  Short Term Goal 5 Progress: Progressing toward goal Long Term Goals Time to Complete Long Term Goals: 8 weeks Long Term Goal 1: Patient will return to highest level of independence in all B/IADL tasks.  Long Term Goal 1 Progress: Progressing toward goal Long Term Goal 2: Patient will increase AROM to WNL to increase ability to reach items above head height.  Long Term Goal 2 Progress: Progressing toward goal Long Term Goal 3: Patient will decrease pain to 2/10 while completing daily activities.  Long Term Goal 3 Progress: Progressing toward goal Long Term Goal 4: Patient will decrease fascial restrictions from mod to min amount.   Long Term Goal 4 Progress: Progressing toward goal Long Term Goal 5: Patient will increase strength to 4/5 to increase ability to use right arm during work tasks, including reaching high objects and lifting heavy objects.  Long Term Goal 5 Progress: Progressing toward goal Additional Long Term Goals?: Yes  Problem List Patient Active Problem List   Diagnosis Date Noted  . Pain in joint, shoulder region 08/30/2014  . Muscle weakness (generalized) 08/30/2014  . Decreased range of motion of right shoulder 08/30/2014  . 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    End of   Session Activity Tolerance: Patient tolerated treatment well General Behavior During  Therapy: WFL for tasks assessed/performed  GO Functional Assessment Tool Used: FOTO Score: 54/100 (46% impaired) Functional Limitation: Carrying, moving and handling objects Carrying, Moving and Handling Objects Current Status (G8984): At least 40 percent but less than 60 percent impaired, limited or restricted Carrying, Moving and Handling Objects Goal Status (G8985): At least 1 percent but less than 20 percent impaired, limited or restricted  Laura Essenmacher, OTR/L,CBIS   09/04/2014, 12:10 PM 

## 2014-09-06 ENCOUNTER — Ambulatory Visit (HOSPITAL_COMMUNITY)
Admission: RE | Admit: 2014-09-06 | Discharge: 2014-09-06 | Disposition: A | Discharge status: Home / Self Care | Payer: BC Managed Care – PPO | Source: Ambulatory Visit | Attending: Orthopedic Surgery | Admitting: Orthopedic Surgery

## 2014-09-06 DIAGNOSIS — IMO0001 Reserved for inherently not codable concepts without codable children: Secondary | ICD-10-CM | POA: Diagnosis not present

## 2014-09-06 NOTE — Progress Notes (Signed)
Occupational Therapy Treatment Patient Details  Name: Rodney Brown MRN: [id number] Date of Birth: 1991/09/07  Today's Date: 09/06/2014 Time: 3382-5053 OT Time Calculation (min): 47 min MFR: 9767-3419 26' Therex: 3790-2409 21'  Visit#: 3 of 8  Re-eval: 09/27/14    Authorization: BC[employer] Medicare OOS  Authorization Time Period: before 10th visit  Authorization Visit#: 3 of 10  Subjective Symptoms/Limitations Symptoms: S: I went to the doctor yesterday and he recommended staying out of work for 4 more weeks.  Pain Assessment Currently in Pain?: No/denies  Precautions/Restrictions  Precautions Precautions: None Precaution Comments: progress as tolerated  Exercise/Treatments Supine Protraction: PROM;10 reps;AAROM;12 reps Horizontal ABduction: PROM;10 reps;AAROM;12 reps External Rotation: PROM;10 reps;AAROM;12 reps Internal Rotation: PROM;10 reps;AAROM;12 reps Flexion: PROM;10 reps;AAROM;12 reps ABduction: PROM;10 reps;AAROM;12 reps        Manual Therapy Manual Therapy: Myofascial release Myofascial Release: Myofascial release and manual stretching to right upper arm, shoulder, trapezius, and scapularis regions to decrease pain and fascial restrictions and improve mobility a pain free zone  Occupational Therapy Assessment and Plan OT Assessment and Plan Clinical Impression Statement: A: Added AAROM exercises in supine, 12X. Patient tolerated well. Patient noted to have tightness in pectoralis minor region, therapist performed pectoralis minor trigger point release using green weighted ball.  Patient was educated on performing trigger point release to pectoralis minor muslce using ball at home.  OT Plan: P: Add pro/ret/ele/dep exercise. Attempt AAROM in standing.    Goals Short Term Goals Time to Complete Short Term Goals: 4 weeks Short Term Goal 1: Patient will be educated on HEP.  Short Term Goal 2: Patient will increase AROM to [facility] to increase ability to reach items  above shoulder height.  Short Term Goal 3: Patient will decrease pain to 5/10 while completing daily activities.  Short Term Goal 4: Patient will decrease fascial restrictions from max to mod amount.   Short Term Goal 5: Patient will increase strength to 3/5 to increase ability to use right arm during work tasks, including reaching and lifting objects.  Long Term Goals Time to Complete Long Term Goals: 8 weeks Long Term Goal 1: Patient will return to highest level of independence in all B/IADL tasks.  Long Term Goal 2: Patient will increase AROM to WNL to increase ability to reach items above head height.  Long Term Goal 3: Patient will decrease pain to 2/10 while completing daily activities.  Long Term Goal 4: Patient will decrease fascial restrictions from mod to min amount.   Long Term Goal 5: Patient will increase strength to 4/5 to increase ability to use right arm during work tasks, including reaching high objects and lifting heavy objects.   Problem List Patient Active Problem List   Diagnosis Date Noted  . Pain in joint, shoulder region 08/30/2014  . Muscle weakness (generalized) 08/30/2014  . Decreased range of motion of right shoulder 08/30/2014  . 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    End of Session Activity Tolerance: Patient tolerated treatment well General Behavior During Therapy: [facility] for tasks assessed/performed OT Plan of Care OT Home Exercise Plan: AAROM exercises OT Patient Instructions: handout (scanned) Consulted and Agree with Plan of Care: Patient   Ailene Ravel, OTR/L,CBIS   09/06/2014, 12:07 PM

## 2014-09-12 ENCOUNTER — Ambulatory Visit (HOSPITAL_COMMUNITY)
Admission: RE | Admit: 2014-09-12 | Discharge: 2014-09-12 | Disposition: A | Discharge status: Home / Self Care | Payer: BC Managed Care – PPO | Source: Ambulatory Visit | Attending: Orthopedic Surgery | Admitting: Orthopedic Surgery

## 2014-09-12 DIAGNOSIS — IMO0001 Reserved for inherently not codable concepts without codable children: Secondary | ICD-10-CM | POA: Diagnosis not present

## 2014-09-12 NOTE — Progress Notes (Signed)
Occupational Therapy Treatment Patient Details  Name: Rodney Brown MRN: [id number] Date of Birth: 09/08/1991  Today's Date: 09/12/2014 Time: 1350-1430 OT Time Calculation (min): 40 min Manual 1350-1416 (26') Therapeutic Exercises 8413-2440 (14')  Visit#: 4 of 8  Re-eval: 09/27/14    Authorization: BC[employer] Medicare OOS  Authorization Time Period: before 10th visit  Authorization Visit#: 4 of 10  Subjective Symptoms/Limitations Symptoms: "Just a little uncomfortable.  On the inside of my armpit." Pain Assessment Currently in Pain?: No/denies  Precautions/Restrictions     Exercise/Treatments Supine Protraction: PROM;10 reps;AAROM;15 reps Horizontal ABduction: PROM;10 reps;AAROM;15 reps External Rotation: PROM;10 reps;AAROM;15 reps Internal Rotation: PROM;10 reps;AAROM;15 reps Flexion: PROM;10 reps;AAROM;15 reps ABduction: PROM;10 reps;AAROM;15 reps ROM / Strengthening / Isometric Strengthening Thumb Tacks: 1 min Prot/Ret//Elev/Dep: 1 min   Manual Therapy Manual Therapy: Myofascial release Myofascial Release: Myofascial release and manual stretching to right upper arm, shoulder, trapezius, and scapularis regions to decrease pain and fascial restrictions and improve mobility a pain free zone.    Occupational Therapy Assessment and Plan OT Assessment and Plan Clinical Impression Statement: Continued AAROM exercises supine this session. pt tolerated well.  Indiacted he has more pain in right axillary region with abduction.  Added pro/ret/elev/dep this session, and pt tolerated well. Verbalized a good stretch. OT Plan: P: Attempt AAROM in standing.    Goals Short Term Goals Short Term Goal 1: Patient will be educated on HEP.  Short Term Goal 1 Progress: Progressing toward goal Short Term Goal 2: Patient will increase AROM to [facility] to increase ability to reach items above shoulder height.  Short Term Goal 2 Progress: Progressing toward goal Short Term Goal 3: Patient will  decrease pain to 5/10 while completing daily activities.  Short Term Goal 3 Progress: Progressing toward goal Short Term Goal 4: Patient will decrease fascial restrictions from max to mod amount.   Short Term Goal 4 Progress: Progressing toward goal Short Term Goal 5: Patient will increase strength to 3/5 to increase ability to use right arm during work tasks, including reaching and lifting objects.  Short Term Goal 5 Progress: Progressing toward goal Long Term Goals Long Term Goal 1: Patient will return to highest level of independence in all B/IADL tasks.  Long Term Goal 1 Progress: Progressing toward goal Long Term Goal 2: Patient will increase AROM to WNL to increase ability to reach items above head height.  Long Term Goal 2 Progress: Progressing toward goal Long Term Goal 3: Patient will decrease pain to 2/10 while completing daily activities.  Long Term Goal 3 Progress: Progressing toward goal Long Term Goal 4: Patient will decrease fascial restrictions from mod to min amount.   Long Term Goal 4 Progress: Progressing toward goal Long Term Goal 5: Patient will increase strength to 4/5 to increase ability to use right arm during work tasks, including reaching high objects and lifting heavy objects.  Long Term Goal 5 Progress: Progressing toward goal Additional Long Term Goals?: Yes  Problem List Patient Active Problem List   Diagnosis Date Noted  . Pain in joint, shoulder region 08/30/2014  . Muscle weakness (generalized) 08/30/2014  . Decreased range of motion of right shoulder 08/30/2014  . 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    End of Session Activity Tolerance: Patient tolerated treatment well General Behavior During Therapy: [facility] for tasks assessed/performed  GO    Bea Graff, MS, OTR/L [phone]  09/12/2014, 4:39 PM

## 2014-09-14 ENCOUNTER — Ambulatory Visit (HOSPITAL_COMMUNITY)
Admission: RE | Admit: 2014-09-14 | Discharge: 2014-09-14 | Disposition: A | Discharge status: Home / Self Care | Payer: BC Managed Care – PPO | Source: Ambulatory Visit | Attending: Orthopedic Surgery | Admitting: Orthopedic Surgery

## 2014-09-14 DIAGNOSIS — IMO0001 Reserved for inherently not codable concepts without codable children: Secondary | ICD-10-CM | POA: Diagnosis not present

## 2014-09-14 NOTE — Progress Notes (Signed)
Occupational Therapy Treatment Patient Details  Name: JARRAH BABICH MRN: [id number] Date of Birth: 03-02-1991  Today's Date: 09/14/2014 Time: 9147-8295 OT Time Calculation (min): 46 min Manual 1110-1133 (23') Therapeutic Exercises 1133-1146 (13')  Visit#: 5 of 8  Re-eval: 09/27/14    Authorization: BCBS Medicare OOS  Authorization Time Period: before 10th visit  Authorization Visit#: 5 of 10  Subjective Symptoms/Limitations Symptoms: "I woke up today and everyting seemed a little more tense." Pain Assessment Currently in Pain?: No/denies  Precautions/Restrictions     Exercise/Treatments Supine Protraction: PROM;10 reps;AAROM;15 reps Horizontal ABduction: PROM;10 reps;AAROM;15 reps External Rotation: PROM;10 reps;AAROM;15 reps Internal Rotation: PROM;10 reps;AAROM;15 reps Flexion: PROM;10 reps;AAROM;15 reps ABduction: PROM;10 reps;AAROM;15 reps Standing Protraction: AAROM;10 reps Horizontal ABduction: AAROM;10 reps External Rotation: AAROM;10 reps Internal Rotation: AAROM;10 reps Flexion: AAROM;10 reps ABduction: AAROM;10 reps   Manual Therapy Manual Therapy: Myofascial release Myofascial Release: Myofascial release and manual stretching to right upper arm, shoulder, trapezius, and scapularis regions to decrease pain and fascial restrictions and improve mobility a pain free zone.    Occupational Therapy Assessment and Plan OT Assessment and Plan Clinical Impression Statement: Pt with some increased fascial restrictions this date, likely due to pressure change.  continued supine AAROM and pt tolerated well.  Pt did not have increase in pain after previous session.  Added standing AAROM and pt tolerated well, verbalizing a good stretch.  Pt indicated he was not doing AAROM exercises at home.  Educated pt on supine AAROm exercises for HEP - pt verbalized understanding.   OT Plan: Follow up on HEP.  Increase standing AAROM reps.  Add scapular theraband.   Goals Short  Term Goals Short Term Goal 1: Patient will be educated on HEP.  Short Term Goal 1 Progress: Progressing toward goal Short Term Goal 2: Patient will increase AROM to [facility] to increase ability to reach items above shoulder height.  Short Term Goal 2 Progress: Progressing toward goal Short Term Goal 3: Patient will decrease pain to 5/10 while completing daily activities.  Short Term Goal 3 Progress: Progressing toward goal Short Term Goal 4: Patient will decrease fascial restrictions from max to mod amount.   Short Term Goal 4 Progress: Progressing toward goal Short Term Goal 5: Patient will increase strength to 3/5 to increase ability to use right arm during work tasks, including reaching and lifting objects.  Short Term Goal 5 Progress: Progressing toward goal Long Term Goals Long Term Goal 1: Patient will return to highest level of independence in all B/IADL tasks.  Long Term Goal 1 Progress: Progressing toward goal Long Term Goal 2: Patient will increase AROM to WNL to increase ability to reach items above head height.  Long Term Goal 2 Progress: Progressing toward goal Long Term Goal 3: Patient will decrease pain to 2/10 while completing daily activities.  Long Term Goal 3 Progress: Progressing toward goal Long Term Goal 4: Patient will decrease fascial restrictions from mod to min amount.   Long Term Goal 4 Progress: Progressing toward goal Long Term Goal 5: Patient will increase strength to 4/5 to increase ability to use right arm during work tasks, including reaching high objects and lifting heavy objects.  Long Term Goal 5 Progress: Progressing toward goal  Problem List Patient Active Problem List   Diagnosis Date Noted  . Pain in joint, shoulder region 08/30/2014  . Muscle weakness (generalized) 08/30/2014  . Decreased range of motion of right shoulder 08/30/2014  . 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  End of Session Activity Tolerance: Patient tolerated treatment  well General Behavior During Therapy: WFL for tasks assessed/performed OT Plan of Care OT Home Exercise Plan: AAROM exercises OT Patient Instructions: handout (scanned) Consulted and Agree with Plan of Care: Patient  GO   Marry Guan, MS, OTR/L [phone]  09/14/2014, 12:00 PM

## 2014-09-18 ENCOUNTER — Inpatient Hospital Stay (HOSPITAL_COMMUNITY): Admission: RE | Admit: 2014-09-18 | Payer: MEDICARE | Source: Ambulatory Visit

## 2014-09-20 ENCOUNTER — Ambulatory Visit (HOSPITAL_COMMUNITY)
Admission: RE | Admit: 2014-09-20 | Discharge: 2014-09-20 | Disposition: A | Discharge status: Home / Self Care | Payer: BC Managed Care – PPO | Source: Ambulatory Visit | Attending: Orthopedic Surgery | Admitting: Orthopedic Surgery

## 2014-09-20 DIAGNOSIS — M6281 Muscle weakness (generalized): Secondary | ICD-10-CM | POA: Insufficient documentation

## 2014-09-20 DIAGNOSIS — M25511 Pain in right shoulder: Secondary | ICD-10-CM | POA: Insufficient documentation

## 2014-09-20 DIAGNOSIS — M25611 Stiffness of right shoulder, not elsewhere classified: Secondary | ICD-10-CM | POA: Insufficient documentation

## 2014-09-20 DIAGNOSIS — Z5189 Encounter for other specified aftercare: Secondary | ICD-10-CM | POA: Diagnosis present

## 2014-09-20 NOTE — Progress Notes (Signed)
Occupational Therapy Treatment Patient Details  Name: Rodney Brown MRN: [id number] Date of Birth: April 14, 1991  Today's Date: 09/20/2014 Time: 4098-11911113-1145 OT Time Calculation (min): 32 min MFR: 4782-95621113-1132 19' Therex: 1308-65781132-1145 13'  Visit#: 6 of 8  Re-eval: 09/27/14    Authorization: BC[employer] Medicare OOS  Authorization Time Period: before 10th visit  Authorization Visit#: 6 of 10  Subjective Symptoms/Limitations Symptoms: S: It's feeling pretty good, it hurts every now and then.   Precautions/Restrictions  Precautions Precautions: None Precaution Comments: progress as tolerated  Exercise/Treatments Supine Protraction: PROM;5 reps;AROM;12 reps Horizontal ABduction: PROM;5 reps;AROM;12 reps External Rotation: PROM;5 reps;AAROM;15 reps Internal Rotation: PROM;5 reps;AAROM;15 reps Flexion: PROM;5 reps;AROM;12 reps ABduction: PROM;5 reps;AROM;12 reps    ROM / Strengthening / Isometric Strengthening Proximal Shoulder Strengthening, Supine: 2 sets of 12        Manual Therapy Manual Therapy: Myofascial release Myofascial Release: Myofascial release and manual stretching to right upper arm, shoulder, trapezius, and scapularis regions to decrease pain and fascial restrictions and improve mobility a pain free zone.   Occupational Therapy Assessment and Plan OT Assessment and Plan Clinical Impression Statement: A: Added AROM exercises and proximal shoulder strengthening exercises in supine.  Patient tolerated treatment well. Provided patient will AROM HEP. Did not complete scapular theraband exercises due to time constraints.  OT Plan: P: Add scapular theraband. Attempt AROM in standing.    Goals Short Term Goals Short Term Goal 1: Patient will be educated on HEP.  Short Term Goal 1 Progress: Progressing toward goal Short Term Goal 2: [facility] to increase ability to reach items above shoulder height.  Short Term Goal 2 Progress: Progressing toward  goal Short Term Goal 3: Patient will decrease pain to 5/10 while completing daily activities.  Short Term Goal 3 Progress: Progressing toward goal Short Term Goal 4: Patient will decrease fascial restrictions from max to mod amount.   Short Term Goal 4 Progress: Progressing toward goal Short Term Goal 5: Patient will increase strength to 3/5 to increase ability to use right arm during work tasks, including reaching and lifting objects.  Short Term Goal 5 Progress: Progressing toward goal Long Term Goals Long Term Goal 1: Patient will return to highest level of independence in all B/IADL tasks.  Long Term Goal 1 Progress: Progressing toward goal Long Term Goal 2: Patient will increase AROM to WNL to increase ability to reach items above head height.  Long Term Goal 2 Progress: Progressing toward goal Long Term Goal 3: Patient will decrease pain to 2/10 while completing daily activities.  Long Term Goal 3 Progress: Progressing toward goal Long Term Goal 4: Patient will decrease fascial restrictions from mod to min amount.   Long Term Goal 4 Progress: Progressing toward goal Long Term Goal 5: Patient will increase strength to 4/5 to increase ability to use right arm during work tasks, including reaching high objects and lifting heavy objects.  Long Term Goal 5 Progress: Progressing toward goal  Problem List Patient Active Problem List   Diagnosis Date Noted  . Pain in joint, shoulder region 08/30/2014  . Muscle weakness (generalized) 08/30/2014  . Decreased range of motion of right shoulder 08/30/2014  . 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    End of Session Activity Tolerance: Patient tolerated treatment well General Behavior During Therapy: [facility] tasks assessed/performed OT Plan of Care OT Home Exercise Plan: AROM exercises OT Patient Instructions: handout (scanned) Consulted and Agree with Plan of Care: Patient   Rodney RiegerLaura  [name], OTR/L,CBIS   09/20/2014, 11:51  AM

## 2014-09-25 ENCOUNTER — Ambulatory Visit (HOSPITAL_COMMUNITY)
Admission: RE | Admit: 2014-09-25 | Discharge: 2014-09-25 | Disposition: A | Discharge status: Home / Self Care | Payer: BC Managed Care – PPO | Source: Ambulatory Visit | Attending: Orthopedic Surgery | Admitting: Orthopedic Surgery

## 2014-09-25 DIAGNOSIS — Z5189 Encounter for other specified aftercare: Secondary | ICD-10-CM | POA: Diagnosis not present

## 2014-09-25 NOTE — Progress Notes (Signed)
Occupational Therapy Treatment Patient Details  Name: Rodney Brown W Adamski MRN: [id number] Date of Birth: 09-23-1991  Today's Date: 09/25/2014 Time: [id number] OT Time Calculation (min): 44 min MFR: [id number] 11' Therex: [id number] 33'  Visit#: 7 of 8  Re-eval: 09/27/14    Authorization: BCBS Medicare OOS  Authorization Time Period: before 10th visit  Authorization Visit#: 7 of 10  Subjective Symptoms/Limitations Symptoms: S: I'm still have trouble getting that arm to turn out.   Pain Assessment Currently in Pain?: No/denies  Precautions/Restrictions  Precautions Precautions: None Precaution Comments: progress as tolerated  Exercise/Treatments Supine Protraction: PROM;5 reps;AROM;12 reps Horizontal ABduction: PROM;5 reps;AROM;12 reps External Rotation: PROM;5 reps;AAROM;15 reps Internal Rotation: PROM;5 reps;AAROM;15 reps Flexion: PROM;5 reps;AROM;12 reps ABduction: PROM;5 reps;AROM;12 reps   Standing Protraction: AROM;15 reps Horizontal ABduction: AROM;15 reps External Rotation: AAROM;15 reps Internal Rotation: AAROM;15 reps Flexion: AROM;15 reps ABduction: AROM;15 reps Extension: Theraband;15 reps Theraband Level (Shoulder Extension): Level 3 (Green) Row: Theraband;15 reps Theraband Level (Shoulder Row): Level 3 (Green) Retraction: Theraband;15 reps Theraband Level (Shoulder Retraction): Level 3 (Green)   ROM / Strengthening / Isometric Strengthening Proximal Shoulder Strengthening, Supine: 15X Proximal Shoulder Strengthening, Seated: 15X        Manual Therapy Manual Therapy: Myofascial release Myofascial Release: Myofascial release and manual stretching to right upper arm, shoulder, trapezius, and scapularis regions to decrease pain and fascial restrictions and improve mobility a pain free zone.  Occupational Therapy Assessment and Plan OT Assessment and Plan Clinical Impression Statement: A: Added AROM and proximal shoulder strengthening exercises in  standing. Added green theraband exercises. Patient tolerated treatment well. Patient demonstrates AROM WFL, with the exception of ER.  Provided patient with shoulder stretching HEP.  OT Plan: P: Reassess. Attempt strengthening exercises using 1# weight in supine and standing. Attempt Cybex row/press. Attempt UBE.    Goals Short Term Goals Short Term Goal 1: Patient will be educated on HEP.  Short Term Goal 1 Progress: Progressing toward goal Short Term Goal 2: Patient will [facility] to [facility] to increase ability to reach items above shoulder height.  Short Term Goal 2 Progress: Progressing toward goal Short Term Goal 3: Patient will decrease pain to 5/10 while completing daily activities.  Short Term Goal 3 Progress: Progressing toward goal Short Term Goal 4: Patient will decrease fascial restrictions from max to mod amount.   Short Term Goal 4 Progress: Progressing toward goal Short Term Goal 5: Patient will increase strength to 3/5 to increase ability to use right arm during work tasks, including reaching and lifting objects.  Short Term Goal 5 Progress: Progressing toward goal Long Term Goals Long Term Goal 1: Patient will return to highest level of independence in all B/IADL tasks.  Long Term Goal 1 Progress: Progressing toward goal Long Term Goal 2: Patient will increase AROM to WNL to increase ability to reach items above head height.  Long Term Goal 2 Progress: Progressing toward goal Long Term Goal 3: Patient will decrease pain to 2/10 while completing daily activities.  Long Term Goal 3 Progress: Progressing toward goal Long Term Goal 4: Patient will decrease fascial restrictions from mod to min amount.   Long Term Goal 4 Progress: Progressing toward goal Long Term Goal 5: Patient will increase strength to 4/5 to increase ability to use right arm during work tasks, including reaching high objects and lifting heavy objects.  Long Term Goal 5 Progress: Progressing toward  goal  Problem List Patient Active Problem List   Diagnosis Date Noted  . Pain in  joint, shoulder region 08/30/2014  . Muscle weakness (generalized) 08/30/2014  . Decreased range of motion of right shoulder 08/30/2014  . 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    End of Session Activity Tolerance: Patient tolerated treatment well General Behavior During Therapy: [facility] for tasks assessed/performed OT Plan of Care OT Home Exercise Plan: shoulder stretches OT Patient Instructions: handout (scanned) Consulted and Agree with Plan of Care: Patient   Limmie Patricia, OTR/L,CBIS   09/25/2014, 11:55 AM

## 2014-09-27 ENCOUNTER — Ambulatory Visit (HOSPITAL_COMMUNITY)
Admission: RE | Admit: 2014-09-27 | Payer: BC Managed Care – PPO | Source: Ambulatory Visit | Attending: Orthopedic Surgery | Admitting: Orthopedic Surgery

## 2014-09-27 ENCOUNTER — Telehealth (HOSPITAL_COMMUNITY): Payer: Self-pay

## 2014-09-27 NOTE — Telephone Encounter (Signed)
Left a message to make more appointments

## 2014-10-02 ENCOUNTER — Inpatient Hospital Stay (HOSPITAL_COMMUNITY): Admission: RE | Admit: 2014-10-02 | Payer: MEDICARE | Source: Ambulatory Visit

## 2014-10-05 ENCOUNTER — Ambulatory Visit (HOSPITAL_COMMUNITY)
Admission: RE | Admit: 2014-10-05 | Discharge: 2014-10-05 | Disposition: A | Discharge status: Home / Self Care | Payer: BC Managed Care – PPO | Source: Ambulatory Visit | Attending: Orthopedic Surgery | Admitting: Orthopedic Surgery

## 2014-10-05 DIAGNOSIS — Z5189 Encounter for other specified aftercare: Secondary | ICD-10-CM | POA: Diagnosis not present

## 2014-10-05 NOTE — Evaluation (Signed)
Occupational Therapy Re-Evaluation  Patient Details  Name: Rodney Brown MRN: [id number] Date of Birth: 1991-04-25  Today's Date: 10/05/2014 Time: 7793-9030 OT Time Calculation (min): 52 min Manual 1518-1533 (15') ROM Testing 1533-1543 (10') Therapeutic Exercises 1543-1600 ([place] 0923-3007 (10')  Visit#: 8 of 16  Re-eval: 11/02/14  Assessment Diagnosis: right shoulder latarjet procedure Next MD Visit: 2 month follow up  Authorization: [employer] Medicare OOS  Authorization Time Period: Before 18th visit  Authorization Visit#: 8 of 10   Past Medical History:  Past Medical History  Diagnosis Date  . 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 Rash of neck 06/26/2014  . 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 Shoulder surgery Right     x 2    Subjective Symptoms/Limitations Symptoms: "Just about everything has improved.  That strength just isn't there." Special Tests: FOTO score current 67/100;   Previous 54/100 Pain Assessment Currently in Pain?: No/denies  Assessment ADL/Vision/Perception ADL ADL Comments: pt reports that all bilateral UE tasks have gotten easier.  Additional Assessments RUE AROM (degrees) RUE Overall AROM Comments: Assess in standing, with ER/IR adducted (Previous Eval 9/10) Right Shoulder Flexion: 155 Degrees (116) Right Shoulder ABduction: 157 Degrees (114) Right Shoulder Internal Rotation: 90 Degrees (90) Right Shoulder External Rotation: 35 Degrees (9) RUE Strength RUE Overall Strength Comments: ER/IR adducted Right Shoulder Flexion: 4/5 Right Shoulder ABduction: 4/5 Right Shoulder Internal Rotation: 4/5 Right Shoulder External Rotation: 4/5 Palpation Palpation: mod-min fascial restricions in right upper arm region. trace restrictions in scap and trap regions.     Exercise/Treatments Supine Protraction: PROM;5 reps;Strengthening;10  reps;Weights Protraction Weight (lbs): 1 Horizontal ABduction: PROM;5 reps;Strengthening;10 reps;Weights Horizontal ABduction Weight (lbs): 1 External Rotation: PROM;5 reps;Strengthening;10 reps;Weights External Rotation Weight (lbs): 1 Internal Rotation: PROM;5 reps;Strengthening;10 reps;Weights Internal Rotation Weight (lbs): 1 Flexion: PROM;5 reps;Right;10 reps;Weights Shoulder Flexion Weight (lbs): 1 ABduction: PROM;5 reps;Strengthening;10 reps;Weights Shoulder ABduction Weight (lbs): 1 Standing Protraction: 10 reps;Weights;Strengthening Protraction Weight (lbs): 1 Horizontal ABduction: Strengthening;10 reps;Weights Horizontal ABduction Weight (lbs): 1 External Rotation: Strengthening;Weights External Rotation Weight (lbs): 1 Internal Rotation: Strengthening;Weights;10 reps Internal Rotation Weight (lbs): 1 Flexion: Strengthening;10 reps;Weights Shoulder Flexion Weight (lbs): 1 ABduction: Strengthening;10 reps;Weights Shoulder ABduction Weight (lbs): 1      Manual Therapy Manual Therapy: Myofascial release Myofascial Release: Myofascial release and manual stretching to right upper arm, shoulder, trapezius, and scapularis regions to decrease pain and fascial restrictions and improve mobility a pain free zone.     Occupational Therapy Assessment and Plan OT Assessment and Plan Clinical Impression Statement: Re-evaluation completed this session.  Pt has met all STG and is progressing towards all LTG.  Upgraded strength LTG, as pt met initial goal of 4/5 strength.  Pt indicated that he feels better in nearly all areas of functional use of his arm, but he still feels weak.  Initiated 1# strengthening exercises this session. pt tolerated well, and had improved form when completing standing exercises in front of a mirror.  pt was supposed to begin work this week, but his job has placed him on hold until his strength returns.  Proivded pt with strengthening HEP. OT Frequency: Min  2X/week OT Duration: 4 weeks OT Plan: Continue OT sessions for an additional 4 weeks to focus on strengthening.  follow up on strengthening HEP.  add cybex press/row and UBE.   Goals Short Term Goals Short Term Goal 1: Patient will be educated on  HEP.  Short Term Goal 1 Progress: Met Short Term Goal 2: Patient will increase AROM to [facility] to increase ability to reach items above shoulder height.  Short Term Goal 2 Progress: Met Short Term Goal 3: Patient will decrease pain to 5/10 while completing daily activities.  Short Term Goal 3 Progress: Met Short Term Goal 4: Patient will decrease fascial restrictions from max to mod amount.   Short Term Goal 4 Progress: Met Short Term Goal 5: Patient will increase strength to 3/5 to increase ability to use right arm during work tasks, including reaching and lifting objects.  Short Term Goal 5 Progress: Met Long Term Goals Long Term Goal 1: Patient will return to highest level of independence in all B/IADL tasks.  Long Term Goal 1 Progress: Progressing toward goal Long Term Goal 2: Patient will increase AROM to WNL to increase ability to reach items above head height.  Long Term Goal 2 Progress: Progressing toward goal Long Term Goal 3: Patient will decrease pain to 2/10 while completing daily activities.  Long Term Goal 3 Progress: Progressing toward goal Long Term Goal 4: Patient will decrease fascial restrictions from mod to min amount.   Long Term Goal 4 Progress: Progressing toward goal Long Term Goal 5: Patient will increase strength to 5/5 to increase ability to use right arm during work tasks, including reaching high objects and lifting heavy objects.  Long Term Goal 5 Progress: Revised (modified due to lack of progress/goal met)  Problem List Patient Active Problem List   Diagnosis Date Noted  . Pain in joint, shoulder region 08/30/2014  . Muscle weakness (generalized) 08/30/2014  . Decreased range of motion of right shoulder 08/30/2014   . 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    End of Session Activity Tolerance: Patient tolerated treatment well General Behavior During Therapy: [facility] for tasks assessed/performed OT Plan of Care OT Home Exercise Plan: shoulder stretches      10/16 added standing strengthening exercises with 1# weights/soup cans OT Patient Instructions: handout (scanned). explained and demonstrated. pt verbalized understanding. Consulted and Agree with Plan of Care: Patient  GO Functional Assessment Tool Used: FOTO Score: 67/100 (33% impaired) Functional Limitation: Carrying, moving and handling objects Carrying, Moving and Handling Objects Current Status ([id number]): At least 20 percent but less than 40 percent impaired, limited or restricted Carrying, Moving and Handling Objects Goal Status [phone]): At least 1 percent but less than 20 percent impaired, limited or restricted   Pearson Grippe, MS, OTR/L [place] 10/05/2014, 4:28 PM   Physician Documentation Your signature is required to indicate approval of the treatment plan as stated above.  Please sign and either send electronically or make a copy of this report for your files and return this physician signed original.  Please mark one 1.__approve of plan  2. ___approve of plan with the following conditions.   ______________________________                                                          _____________________ Physician Signature  Date  

## 2014-10-09 ENCOUNTER — Inpatient Hospital Stay (HOSPITAL_COMMUNITY): Admission: RE | Admit: 2014-10-09 | Payer: MEDICARE | Source: Ambulatory Visit

## 2014-10-09 ENCOUNTER — Ambulatory Visit (HOSPITAL_COMMUNITY)
Admission: RE | Admit: 2014-10-09 | Discharge: 2014-10-09 | Disposition: A | Discharge status: Home / Self Care | Payer: BC Managed Care – PPO | Source: Ambulatory Visit | Attending: Orthopedic Surgery | Admitting: Orthopedic Surgery

## 2014-10-09 DIAGNOSIS — Z5189 Encounter for other specified aftercare: Secondary | ICD-10-CM | POA: Diagnosis not present

## 2014-10-09 NOTE — Progress Notes (Signed)
Occupational Therapy Treatment Patient Details  Name: Rodney Brown MRN: [id number] Date of Birth: 05-09-1991  Today's Date: 10/09/2014 Time: 5784-6962 OT Time Calculation (min): 45 min Manual 1524-1538 (14') Therapeutic Exercises 9528-4132 (47')  Visit#: 9 of 16  Re-eval: 11/02/14    Authorization: BC[employer] Medicare OOS  Authorization Time Period: Before 18th visit  Authorization Visit#: 9 of 10  Subjective Symptoms/Limitations Symptoms: "I've been using that arm - you said not to be afraid of using it, so i've been usign it. It's still has a weakness to it." Pain Assessment Currently in Pain?: No/denies  Exercise/Treatments Supine Protraction Weight (lbs): 1 Horizontal ABduction: PROM;5 reps;Strengthening;15 reps Horizontal ABduction Weight (lbs): 1 External Rotation: PROM;5 reps;Strengthening;15 reps External Rotation Weight (lbs): 1 Internal Rotation: PROM;5 reps;Strengthening;15 reps Internal Rotation Weight (lbs): 1 Flexion: PROM;5 reps;Strengthening;15 reps Shoulder Flexion Weight (lbs): 1 ABduction: PROM;5 reps;Strengthening;15 reps Shoulder ABduction Weight (lbs): 1 Standing Protraction: Strengthening;15 reps Protraction Weight (lbs): 1 Horizontal ABduction: Strengthening;15 reps Horizontal ABduction Weight (lbs): 1 External Rotation: Strengthening;15 reps External Rotation Weight (lbs): 1 Internal Rotation: Strengthening;15 reps Internal Rotation Weight (lbs): 1 Flexion: Strengthening;15 reps Shoulder Flexion Weight (lbs): 1 ABduction: Strengthening;15 reps Shoulder ABduction Weight (lbs): 1 ROM / Strengthening / Isometric Strengthening UBE (Upper Arm Bike): Level 3.0; 2 min forward and 2 min back Cybex Press: 3 plate;10 reps Cybex Row: 3 plate;10 reps Proximal Shoulder Strengthening, Supine: 15x with 1# weight Proximal Shoulder Strengthening, Seated: 15x with 1# weight   Manual Therapy Manual Therapy: Myofascial release Myofascial Release: Myofascial  release and manual stretching to right upper arm, shoulder, trapezius, and scapularis regions to decrease pain and fascial restrictions and improve mobility a pain free zone.     Occupational Therapy Assessment and Plan OT Assessment and Plan Clinical Impression Statement: Pt reports completing HEP with soup cans. Pt verbalized attempting pushups at home, and only being able to complete 2.  Increased reps with 1# in both supine and standing with good tolerance, but some fatigue in standing.  Added cybex press and row this session, and UBE, with fatigue and good tolerance.     OT Plan: Add wall/raised mat table pushups.  (follow up with pt on smoking cessation)   Goals Short Term Goals Short Term Goal 1: Patient will be educated on HEP.  Short Term Goal 1 Progress: Met Short Term Goal 2: Patient will increase AROM to [facility] to increase ability to reach items above shoulder height.  Short Term Goal 2 Progress: Met Short Term Goal 3: Patient will decrease pain to 5/10 while completing daily activities.  Short Term Goal 3 Progress: Met Short Term Goal 4: Patient will decrease fascial restrictions from max to mod amount.   Short Term Goal 4 Progress: Met Short Term Goal 5: Patient will increase strength to 3/5 to increase ability to use right arm during work tasks, including reaching and lifting objects.  Short Term Goal 5 Progress: Met Long Term Goals Long Term Goal 1: Patient will return to highest level of independence in all B/IADL tasks.  Long Term Goal 1 Progress: Progressing toward goal Long Term Goal 2: Patient will increase AROM to WNL to increase ability to reach items above head height.  Long Term Goal 2 Progress: Progressing toward goal Long Term Goal 3: Patient will decrease pain to 2/10 while completing daily activities.  Long Term Goal 3 Progress: Progressing toward goal Long Term Goal 4: Patient will decrease fascial restrictions from mod to min amount.   Long Term Goal 4  Progress:  Progressing toward goal Long Term Goal 5: Patient will increase strength to 5/5 to increase ability to use right arm during work tasks, including reaching high objects and lifting heavy objects.  Long Term Goal 5 Progress: Progressing toward goal  Problem List Patient Active Problem List   Diagnosis Date Noted  . Pain in joint, shoulder region 08/30/2014  . Muscle weakness (generalized) 08/30/2014  . Decreased range of motion of right shoulder 08/30/2014  . 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    End of Session Activity Tolerance: Patient tolerated treatment well General Behavior During Therapy: [facility] for tasks assessed/performed  GO    Bea Graff Martesha Niedermeier, MS, OTR/L [place] [phone] 10/09/2014, 4:12 PM

## 2014-10-11 ENCOUNTER — Ambulatory Visit (HOSPITAL_COMMUNITY)
Admission: RE | Admit: 2014-10-11 | Discharge: 2014-10-11 | Disposition: A | Discharge status: Home / Self Care | Payer: BC Managed Care – PPO | Source: Ambulatory Visit | Attending: Orthopedic Surgery | Admitting: Orthopedic Surgery

## 2014-10-11 DIAGNOSIS — Z5189 Encounter for other specified aftercare: Secondary | ICD-10-CM | POA: Diagnosis not present

## 2014-10-11 NOTE — Progress Notes (Signed)
Occupational Therapy Treatment Patient Details  Name: Rodney Brown MRN: [id number] Date of Birth: Feb 16, 1991  Today's Date: 10/11/2014 Time: 9147-82951103-1146 OT Time Calculation (min): 43 min MFR: 6213-08651103-1114 11' Therex: [id number] 32'  Visit#: 10 of 16  Re-eval: 11/02/14 Assessment Diagnosis: right shoulder latarjet procedure  Authorization: BCBS Medicare OOS  Authorization Time Period: Before 18th visit  Authorization Visit#: 10 of 18  Subjective Symptoms/Limitations Symptoms: S: My arm was shaking when I left here last time.  Pain Assessment Currently in Pain?: No/denies  Precautions/Restrictions  Precautions Precautions: None Precaution Comments: progress as tolerated  Exercise/Treatments Supine Protraction: PROM;5 reps;Strengthening;10 reps;Weights Protraction Weight (lbs): 1 Horizontal ABduction: PROM;5 reps;Strengthening;15 reps Horizontal ABduction Weight (lbs): 1 External Rotation: PROM;5 reps;Strengthening;15 reps External Rotation Weight (lbs): 1 Internal Rotation: PROM;5 reps;Strengthening;15 reps Internal Rotation Weight (lbs): 1 Flexion: PROM;5 reps;Strengthening;15 reps Shoulder Flexion Weight (lbs): 1 ABduction: PROM;5 reps;Strengthening;15 reps Shoulder ABduction Weight (lbs): 1   Standing Protraction: Strengthening;15 reps Protraction Weight (lbs): 1 Horizontal ABduction: Strengthening;15 reps Horizontal ABduction Weight (lbs): 1 External Rotation: Strengthening;15 reps External Rotation Weight (lbs): 1 Internal Rotation: Strengthening;15 reps Internal Rotation Weight (lbs): 1 Flexion: Strengthening;15 reps Shoulder Flexion Weight (lbs): 1 ABduction: Strengthening;15 reps Shoulder ABduction Weight (lbs): 1   ROM / Strengthening / Isometric Strengthening UBE (Upper Arm Bike): Level 3, 3' forward, 3' reverse Cybex Press: 3 plate;10 reps Cybex Row: 3 plate;10 reps Wall Pushups: 10 reps (raised mat to waist height) Proximal Shoulder Strengthening,  Supine: 15x with 1# weight Proximal Shoulder Strengthening, Seated: 15x with 1# weight        Manual Therapy Manual Therapy: Myofascial release Myofascial Release: Myofascial release and manual stretching to right upper arm, shoulder, trapezius, and scapularis regions to decrease pain and fascial restrictions and improve mobility a pain free zone.  Occupational Therapy Assessment and Plan OT Assessment and Plan Clinical Impression Statement: A: Added push-ups using raised mat table at waist height, 10 repetitions. Continued strengthening exercises. Patient tolerated treatment well. Patient reports he has not smoked in 4 days.  OT Plan: P: Lower mat table to knee height during push-ups. Increase repetitions during Cybex row & press to 15. Add ball on wall.    Goals Short Term Goals Short Term Goal 1: Patient will be educated on HEP.  Short Term Goal 2: Patient will increase AROM to [facility] to increase ability to reach items above shoulder height.  Short Term Goal 3: Patient will decrease pain to 5/10 while completing daily activities.  Short Term Goal 4: Patient will decrease fascial restrictions from max to mod amount.   Short Term Goal 5: Patient will increase strength to 3/5 to increase ability to use right arm during work tasks, including reaching and lifting objects.  Long Term Goals Long Term Goal 1: Patient will return to highest level of independence in all B/IADL tasks.  Long Term Goal 1 Progress: Progressing toward goal Long Term Goal 2: Patient will increase AROM to WNL to increase ability to reach items above head height.  Long Term Goal 2 Progress: Progressing toward goal Long Term Goal 3: Patient will decrease pain to 2/10 while completing daily activities.  Long Term Goal 3 Progress: Progressing toward goal Long Term Goal 4: Patient will decrease fascial restrictions from mod to min amount.   Long Term Goal 4 Progress: Progressing toward goal Long Term Goal 5: Patient will  increase strength to 5/5 to increase ability to use right arm during work tasks, including reaching high objects and lifting heavy objects.  Long Term Goal 5 Progress: Progressing toward goal  Problem List Patient Active Problem List   Diagnosis Date Noted  . Pain in joint, shoulder region 08/30/2014  . Muscle weakness (generalized) 08/30/2014  . Decreased range of motion of right shoulder 08/30/2014  . 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    End of Session Activity Tolerance: Patient tolerated treatment well General Behavior During Therapy: [facility] for tasks assessed/performed   Limmie PatriciaLaura Lynasia [place]oche, OTR/L,CBIS   10/11/2014, 11:58 AM

## 2014-10-16 ENCOUNTER — Ambulatory Visit (HOSPITAL_COMMUNITY)
Admission: RE | Admit: 2014-10-16 | Discharge: 2014-10-16 | Disposition: A | Discharge status: Home / Self Care | Payer: BC Managed Care – PPO | Source: Ambulatory Visit | Attending: Orthopedic Surgery | Admitting: Orthopedic Surgery

## 2014-10-16 DIAGNOSIS — Z5189 Encounter for other specified aftercare: Secondary | ICD-10-CM | POA: Diagnosis not present

## 2014-10-16 NOTE — Progress Notes (Signed)
Occupational Therapy Treatment Patient Details  Name: Rodney Brown MRN: [id number] Date of Birth: 01/16/1991  Today's Date: 10/16/2014 Time: 7829-56211103-1145 OT Time Calculation (min): 42 min MFR: [id number] 14' Therex: [id number] 28'  Visit#: 11 of 16  Re-eval: 11/02/14    Authorization: [employer] Medicare OOS  Authorization Time Period: Before 18th visit  Authorization Visit#: 11 of 18  Subjective Symptoms/Limitations Symptoms: S: It's the same old same, sometimes it gives out suddenly if I'm lifting heavy stuff.  Pain Assessment Currently in Pain?: No/denies  Precautions/Restrictions  Precautions Precautions: None Precaution Comments: progress as tolerated  Exercise/Treatments Supine Protraction: PROM;5 reps;Strengthening;Weights;12 reps Protraction Weight (lbs): 1 Horizontal ABduction: PROM;5 reps;Strengthening;Weights;12 reps Horizontal ABduction Weight (lbs): 1 External Rotation: PROM;5 reps;Strengthening;Weights;12 reps External Rotation Weight (lbs): 1 Internal Rotation: PROM;5 reps;Strengthening;Weights;12 reps Internal Rotation Weight (lbs): 1 Flexion: PROM;5 reps;Strengthening;Weights;12 reps Shoulder Flexion Weight (lbs): 1 ABduction: PROM;5 reps;Strengthening;Weights;12 reps Shoulder ABduction Weight (lbs): 1   Standing Protraction: Strengthening;15 reps Protraction Weight (lbs): 1 Horizontal ABduction: Strengthening;15 reps Horizontal ABduction Weight (lbs): 1 External Rotation: Strengthening;15 reps External Rotation Weight (lbs): 1 Internal Rotation: Strengthening;15 reps Internal Rotation Weight (lbs): 1 Flexion: Strengthening;15 reps Shoulder Flexion Weight (lbs): 1 ABduction: Strengthening;15 reps Shoulder ABduction Weight (lbs): 1   ROM / Strengthening / Isometric Strengthening UBE (Upper Arm Bike): Level 3, 3' forward, 3' reverse Cybex Press: 3 plate;15 reps Cybex Row: 3 plate;15 reps Wall Pushups: 10 reps (raised mat at knee height) Proximal  Shoulder Strengthening, Supine: 15x with 1# weight Proximal Shoulder Strengthening, Seated: 15x with 1# weight        Manual Therapy Manual Therapy: Myofascial release Myofascial Release: Myofascial release and manual stretching to right upper arm, shoulder, trapezius, and scapularis regions to decrease pain and fascial restrictions and improve mobility a pain free zone  Occupational Therapy Assessment and Plan OT Assessment and Plan Clinical Impression Statement: A: Increased Cybex row/press repetitions to 15, adjusted raised mat push-ups to knee height. Patient tolerated treatment well. Patient continues to have difficulty with ER, instructed patient to complete wall stretch. Patient reports he sometimes feels like his arm is just going to give out when he is completing lifting tasks, also had difficulty using a hammer over the weekend.  OT Plan: P: Perform all strengthening exercises at 20X. Add ball on wall. Increase push-ups to 15 repetitions. Attempt 2# weight in supine. Follow up on ER shoulder stretch.    Goals Short Term Goals Short Term Goal 1: Patient will be educated on HEP.  Short Term Goal 2: Patient will increase AROM to [facility] to increase ability to reach items above shoulder height.  Short Term Goal 3: Patient will decrease pain to 5/10 while completing daily activities.  Short Term Goal 4: Patient will decrease fascial restrictions from max to mod amount.   Short Term Goal 5: Patient will increase strength to 3/5 to increase ability to use right arm during work tasks, including reaching and lifting objects.  Long Term Goals Long Term Goal 1: Patient will return to highest level of independence in all B/IADL tasks.  Long Term Goal 1 Progress: Progressing toward goal Long Term Goal 2: Patient will increase AROM to WNL to increase ability to reach items above head height.  Long Term Goal 2 Progress: Progressing toward goal Long Term Goal 3: Patient will decrease pain to 2/10  while completing daily activities.  Long Term Goal 3 Progress: Progressing toward goal Long Term Goal 4: Patient will decrease fascial restrictions from mod to min amount.  Long Term Goal 4 Progress: Progressing toward goal Long Term Goal 5: Patient will increase strength to 5/5 to increase ability to use right arm during work tasks, including reaching high objects and lifting heavy objects.  Long Term Goal 5 Progress: Progressing toward goal  Problem List Patient Active Problem List   Diagnosis Date Noted  . Pain in joint, shoulder region 08/30/2014  . Muscle weakness (generalized) 08/30/2014  . Decreased range of motion of right shoulder 08/30/2014  . 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    End of Session Activity Tolerance: Patient tolerated treatment well General Behavior During Therapy: [facility] for tasks assessed/performed   Limmie PatriciaLaura Riyansh Gerstner, OTR/L,CBIS   10/16/2014, 12:45 PM

## 2014-10-18 ENCOUNTER — Ambulatory Visit (HOSPITAL_COMMUNITY): Payer: MEDICARE

## 2014-10-19 ENCOUNTER — Ambulatory Visit (HOSPITAL_COMMUNITY)
Admission: RE | Admit: 2014-10-19 | Discharge: 2014-10-19 | Disposition: A | Discharge status: Home / Self Care | Payer: BC Managed Care – PPO | Source: Ambulatory Visit | Attending: Orthopedic Surgery | Admitting: Orthopedic Surgery

## 2014-10-19 DIAGNOSIS — Z5189 Encounter for other specified aftercare: Secondary | ICD-10-CM | POA: Diagnosis not present

## 2014-10-19 NOTE — Progress Notes (Signed)
Occupational Therapy Treatment Patient Details  Name: Rodney Brown MRN: [id number] Date of Birth: [birth year]  Today's Date: 10/19/2014 Time: [id number] OT Time Calculation (min): 44 min MFR: [id number] 12' Therex: [id number] 32'  Visit#: 12 of 16  Re-eval: 11/02/14    Authorization: [employer] Medicare OOS  Authorization Time Period: Before 18th visit  Authorization Visit#: 12 of 18  Subjective Symptoms/Limitations Symptoms: S: My shoulder feels great today.  Pain Assessment Currently in Pain?: No/denies  Precautions/Restrictions  Precautions Precautions: None Precaution Comments: progress as tolerated  Exercise/Treatments Supine Protraction: PROM;5 reps;Strengthening;Weights;20 reps Protraction Weight (lbs): 2 Horizontal ABduction: PROM;5 reps;Strengthening;Weights;20 reps Horizontal ABduction Weight (lbs): 2 External Rotation: PROM;5 reps;Strengthening;Weights;20 reps External Rotation Weight (lbs): 2 Internal Rotation: PROM;5 reps;Strengthening;Weights;20 reps Internal Rotation Weight (lbs): 2 Flexion: PROM;5 reps;Strengthening;Weights;20 reps Shoulder Flexion Weight (lbs): 2 ABduction: PROM;5 reps;Strengthening;Weights;20 reps Shoulder ABduction Weight (lbs): 2   Standing Protraction: Strengthening;20 reps Protraction Weight (lbs): 1 Horizontal ABduction: Strengthening;20 reps Horizontal ABduction Weight (lbs): 1 External Rotation: Strengthening;20 reps External Rotation Weight (lbs): 1 Internal Rotation: Strengthening;20 reps Internal Rotation Weight (lbs): 1 Flexion: Strengthening;20 reps Shoulder Flexion Weight (lbs): 1 ABduction: Strengthening;20 reps Shoulder ABduction Weight (lbs): 1   ROM / Strengthening / Isometric Strengthening Wall Pushups: 15 reps (mat raised at knee height) Proximal Shoulder Strengthening, Supine: 20X with 2# weight Proximal Shoulder Strengthening, Seated: 20X with 1# weight Ball on Wall: 1' with arm flexed to 90 degrees; 1' with  arm abducted to 90 degrees        Manual Therapy Manual Therapy: Myofascial release Myofascial Release: Myofascial release and manual stretching to right upper arm, shoulder, trapezius, and scapularis regions to decrease pain and fascial restrictions and improve mobility a pain free zone  Occupational Therapy Assessment and Plan OT Assessment and Plan Clinical Impression Statement: A: Increased all strengthening repetitions to 20; increased weight to 2# supine; increased push-ups to 20 repetitions. Added ball on wall. Patient tolerated treatment well. Patient reports he has not been completing the ER stretch at home.  OT Plan: P: MFR PRN.  Increase weight to 2# in standing. Increase cybex row/press to 3.5#. Follow up on ER stretch at home.    Goals Short Term Goals Short Term Goal 1: Patient will be educated on HEP.  Short Term Goal 2: Patient will increase AROM to [facility] to increase ability to reach items above shoulder height.  Short Term Goal 3: Patient will decrease pain to 5/10 while completing daily activities.  Short Term Goal 4: Patient will decrease fascial restrictions from max to mod amount.   Short Term Goal 5: Patient will increase strength to 3/5 to increase ability to use right arm during work tasks, including reaching and lifting objects.  Long Term Goals Long Term Goal 1: Patient will return to highest level of independence in all B/IADL tasks.  Long Term Goal 2: Patient will increase AROM to WNL to increase ability to reach items above head height.  Long Term Goal 3: Patient will decrease pain to 2/10 while completing daily activities.  Long Term Goal 4: Patient will decrease fascial restrictions from mod to min amount.   Long Term Goal 5: Patient will increase strength to 5/5 to increase ability to use right arm during work tasks, including reaching high objects and lifting heavy objects.   Problem List Patient Active Problem List   Diagnosis Date Noted  . Pain in joint,  shoulder region 08/30/2014  . Muscle weakness (generalized) 08/30/2014  . Decreased range of motion of right  shoulder 08/30/2014  . 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    End of Session Activity Tolerance: Patient tolerated treatment well General Behavior During Therapy: [facility] for tasks assessed/performed   Limmie PatriciaLaura Nakayla Rorabaugh, OTR/L,CBIS   10/19/2014, 12:02 PM

## 2014-10-23 ENCOUNTER — Ambulatory Visit (HOSPITAL_COMMUNITY): Payer: BC Managed Care – PPO

## 2014-10-23 DIAGNOSIS — M6281 Muscle weakness (generalized): Secondary | ICD-10-CM | POA: Insufficient documentation

## 2014-10-23 DIAGNOSIS — M25511 Pain in right shoulder: Secondary | ICD-10-CM | POA: Insufficient documentation

## 2014-10-23 DIAGNOSIS — M25611 Stiffness of right shoulder, not elsewhere classified: Secondary | ICD-10-CM | POA: Insufficient documentation

## 2014-10-23 DIAGNOSIS — Z5189 Encounter for other specified aftercare: Secondary | ICD-10-CM | POA: Insufficient documentation

## 2014-10-25 ENCOUNTER — Ambulatory Visit (HOSPITAL_COMMUNITY)
Admission: RE | Admit: 2014-10-25 | Discharge: 2014-10-25 | Disposition: A | Discharge status: Home / Self Care | Payer: BC Managed Care – PPO | Source: Ambulatory Visit | Attending: Orthopedic Surgery | Admitting: Orthopedic Surgery

## 2014-10-25 ENCOUNTER — Encounter (HOSPITAL_COMMUNITY): Payer: Self-pay

## 2014-10-25 DIAGNOSIS — M25511 Pain in right shoulder: Secondary | ICD-10-CM | POA: Diagnosis not present

## 2014-10-25 DIAGNOSIS — M6281 Muscle weakness (generalized): Secondary | ICD-10-CM

## 2014-10-25 DIAGNOSIS — Z5189 Encounter for other specified aftercare: Secondary | ICD-10-CM | POA: Diagnosis present

## 2014-10-25 DIAGNOSIS — M25611 Stiffness of right shoulder, not elsewhere classified: Secondary | ICD-10-CM | POA: Diagnosis not present

## 2014-10-25 NOTE — Therapy (Addendum)
Occupational Therapy Treatment  Patient Details  Name: Rodney Brown MRN: [id number] Date of Birth: 05/17/1991  Encounter Date: 10/25/2014      OT End of Session - 10/25/14 1102    Visit Number 13   Number of Visits 16   Date for OT Re-Evaluation 11/02/14   OT Start Time 1028   OT Stop Time 1102   OT Time Calculation (min) 34 min   Activity Tolerance Patient tolerated treatment well      Past Medical History  Diagnosis Date  . 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 Rash of neck 06/26/2014  . 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 Shoulder surgery Right     x 2    There were no vitals taken for this visit.  Visit Diagnosis:  Pain in joint, shoulder region, right  Muscle weakness (generalized)  Decreased range of motion of right shoulder     10/25/14 1028  Symptoms/Limitations  Symptoms S: I think I may have slept on my arm wrong. No pain though.  Pain Assessment  Currently in Pain? No/denies   Precautions: Progress as tolerated       OT Treatments/Exercises (OP) - 10/25/14 1037    Shoulder Exercises: Supine   Protraction PROM;5 reps;Strengthening;Weights;20 reps   Protraction Weight (lbs) 2   Horizontal ABduction PROM;5 reps;Strengthening;Weights;20 reps   Horizontal ABduction Weight (lbs) 2   External Rotation PROM;5 reps;Strengthening;Weights;20 reps   External Rotation Weight (lbs) 2   Internal Rotation PROM;5 reps;Strengthening;Weights;20 reps   Internal Rotation Weight (lbs) 2   Flexion PROM;5 reps;Strengthening;Weights;20 reps   Shoulder Flexion Weight (lbs) 2   ABduction PROM;5 reps;Strengthening;Weights;20 reps   Shoulder ABduction Weight (lbs) 2   Shoulder Exercises: Standing   Protraction Strengthening;20 reps   Protraction Weight (lbs) 2   Horizontal ABduction Strengthening;20 reps   Horizontal ABduction Weight (lbs) 2   External Rotation Strengthening;20 reps    External Rotation Weight (lbs) 2   Internal Rotation Strengthening;20 reps   Internal Rotation Weight (lbs) 2   Flexion Strengthening;20 reps   Shoulder Flexion Weight (lbs) 2   ABduction Strengthening;20 reps   Shoulder ABduction Weight (lbs) 2   Shoulder Exercises: ROM/Strengthening   Cybex Press 3.5 plate;15 reps   Cybex Row 3.5 plate;15 reps   Wall Pushups 15 reps   Proximal Shoulder Strengthening, Supine 20X with 2# weight   Proximal Shoulder Strengthening, Seated 20X with 2# weight   Ball on Wall 1' with arm flexed to 90 degrees; 1' with arm abducted to 90 degrees   Neurological Re-education Exercises   Other Exercises 1 X to V arms 2#, 20X   Other Exercises 2 W arms 2#; 20X            OT Short Term Goals - 10/25/14 1029    OT SHORT TERM GOAL #1   Title Patient will be educated on HEP.   Time 4   Period Weeks   Status Achieved   OT SHORT TERM GOAL #2   Title Patient will increase AROM to [facility] to increase ability to reach items above shoulder height.    Time 4   Period Weeks   Status Achieved   OT SHORT TERM GOAL #3   Title Patient will decrease pain to 5/10 while completing daily activities   Time 4   Period Weeks   Status Achieved   OT SHORT TERM GOAL #4  Title Patient will decrease fascial restrictions from max to mod amount.   Time 4   Period Weeks   Status Achieved   OT SHORT TERM GOAL #5   Title Patient will increase strength to 3/5 to increase ability to use right arm during work tasks, including reaching and lifting objects.    Time 4   Period Weeks   Status Achieved          OT Long Term Goals - 10/25/14 1031    OT LONG TERM GOAL #1   Title Patient will return to highest level of independence in all B/IADL tasks.   Time 8   Period Weeks   Status On-going   OT LONG TERM GOAL #2   Title Patient will increase AROM to WNL to increase ability to reach items above head height   Time 8   Period Weeks   Status On-going   OT LONG TERM GOAL #3    Title Patient will decrease pain to 2/10 while completing daily activities.    Time 8   Period Weeks   Status On-going   OT LONG TERM GOAL #4   Title Patient will decrease fascial restrictions from mod to min amount.   Time 8   Period Weeks   Status On-going   OT LONG TERM GOAL #5   Title Patient will increase strength to 5/5 to increase ability to use right arm during work tasks, including reaching high objects and lifting heavy objects   Time 8   Period Weeks   Status On-going          Plan - 10/25/14 1102    Clinical Impression Statement A: Added x to v arms, w arms, added 2# weight standing and increased weight on cybex row/press. Pt tolerated well.    OT Plan P: Increase weight on Cybex row/press. MFR PRN.        Problem List Patient Active Problem List   Diagnosis Date Noted  . Pain in joint, shoulder region 08/30/2014  . Muscle weakness (generalized) 08/30/2014  . Decreased range of motion of right shoulder 08/30/2014  . 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        Rodney Brown, OTR/L,CBIS   10/25/2014, 11:03 AM

## 2014-10-30 ENCOUNTER — Telehealth (HOSPITAL_COMMUNITY): Payer: Self-pay

## 2014-10-30 ENCOUNTER — Ambulatory Visit (HOSPITAL_COMMUNITY): Payer: BC Managed Care – PPO

## 2014-10-30 NOTE — Telephone Encounter (Signed)
Called to follow-up on missed appt and reminded of re-eval appt.

## 2014-10-31 NOTE — Addendum Note (Signed)
Encounter addended by: Jeanene ErbLaura D Amela Handley, OT on: 10/31/2014  9:14 AM<BR>     Documentation filed: Clinical Notes

## 2014-11-01 ENCOUNTER — Ambulatory Visit (HOSPITAL_COMMUNITY)
Admission: RE | Admit: 2014-11-01 | Discharge: 2014-11-01 | Disposition: A | Discharge status: Home / Self Care | Payer: BC Managed Care – PPO | Source: Ambulatory Visit | Attending: Orthopedic Surgery | Admitting: Orthopedic Surgery

## 2014-11-01 DIAGNOSIS — M25611 Stiffness of right shoulder, not elsewhere classified: Secondary | ICD-10-CM

## 2014-11-01 DIAGNOSIS — M25511 Pain in right shoulder: Secondary | ICD-10-CM

## 2014-11-01 DIAGNOSIS — M6281 Muscle weakness (generalized): Secondary | ICD-10-CM

## 2014-11-01 DIAGNOSIS — Z5189 Encounter for other specified aftercare: Secondary | ICD-10-CM | POA: Diagnosis not present

## 2014-11-01 NOTE — Patient Instructions (Signed)
Strengthening: Chest Pull - Resisted   Hold Theraband in front of body with hands about shoulder width a part. Pull band a part and back together slowly. Repeat __15__ times. Complete _1___ set(s) per session.. Repeat _1-2___ session(s) per day.  http://orth.exer.us/926   Copyright  VHI. All rights reserved.   PNF Strengthening: Resisted   Standing with resistive band around each hand, bring right arm up and away, thumb back. Repeat _15___ times per set. Do __1__ sets per session. Do _1-2___ sessions per day.  http://orth.exer.us/918   Copyright  VHI. All rights reserved.   PNF Strengthening: Resisted   Standing with resistive band around each hand, bring right arm up and across body. Repeat __15__ times per set. Do ___1_ sets per session. Do __1-2__ sessions per day.  http://orth.exer.us/920   Copyright  VHI. All rights reserved.    Resisted External Rotation: in Neutral - Bilateral   Sit or stand, tubing in both hands, elbows at sides, bent to 90, forearms forward. Pinch shoulder blades together and rotate forearms out. Keep elbows at sides. Repeat __15__ times per set. Do __1__ sets per session. Do _1-2___ sessions per day.  http://orth.exer.us/966   Copyright  VHI. All rights reserved.   PNF Strengthening: Resisted   Standing, hold resistive band above head. Bring right arm down and out from side. Repeat _15___ times per set. Do __1__ sets per session. Do _1-2___ sessions per day.  http://orth.exer.us/922   Copyright  VHI. All rights reserved.   

## 2014-11-01 NOTE — Therapy (Signed)
Occupational Therapy Reassessment & Discharge  Patient Details  Name: Rodney Brown MRN: [id number] Date of Birth: 1991/10/27  Encounter Date: 11/01/2014      OT End of Session - 11/01/14 1558    Visit Number 14   Number of Visits 16   OT Start Time 7858   OT Stop Time 1558   OT Time Calculation (min) 28 min   Activity Tolerance Patient tolerated treatment well   Behavior During Therapy [facility] for tasks assessed/performed      Past Medical History  Diagnosis Date  . 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 Rash of neck 06/26/2014  . 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 Shoulder surgery Right     x 2    There were no vitals taken for this visit.  Visit Diagnosis:  Pain in joint, shoulder region, right  Muscle weakness (generalized)  Decreased range of motion of right shoulder      Subjective Assessment - 11/01/14 1557    Symptoms S: I started back to work today.    Special Tests FOTO Score: 78/100, previous 67/100   Currently in Pain? No/denies          [facility] OT Assessment - 11/01/14 1603    Assessment   Diagnosis s/p right shoulder latatjet procedure   Precautions   Precautions None   AROM   Overall AROM Comments Assessed in standing, ER/IR adducted    Right Shoulder Flexion 178 Degrees  155 previous   Right Shoulder ABduction 170 Degrees  157 previous   Right Shoulder Internal Rotation 90 Degrees  90 previous   Right Shoulder External Rotation 60 Degrees  35 previous   Strength   Overall Strength Comments Assessed seated, ER/IR adducted    Right Shoulder Flexion 5/5  4/5 previous   Right Shoulder ABduction --  4+/5; 4/5 previous   Right Shoulder Internal Rotation --  4+/5; 4/5 previous   Right Shoulder External Rotation 4/5  same previous            OT Education - 11/01/14 1556    Education provided Yes   Education Details Theraband HEP    Person(s) Educated  Patient   Methods Explanation;Demonstration;Handout   Comprehension Verbalized understanding;Returned demonstration          OT Short Term Goals - 11/01/14 1536    OT SHORT TERM GOAL #1   Title Patient will be educated on HEP.   Time 4   Period Weeks   Status --   OT SHORT TERM GOAL #2   Title Patient will increase AROM to [facility] to increase ability to reach items above shoulder height.    Time 4   Period Weeks   Status --   OT SHORT TERM GOAL #3   Title Patient will decrease pain to 5/10 while completing daily activities   Time 4   Period Weeks   Status --   OT SHORT TERM GOAL #4   Title Patient will decrease fascial restrictions from max to mod amount.   Time 4   Period Weeks   Status --   OT SHORT TERM GOAL #5   Title Patient will increase strength to 3/5 to increase ability to use right arm during work tasks, including reaching and lifting objects.    Time 4   Period Weeks   Status --          OT Long  Term Goals - 2014-11-28 1536    OT LONG TERM GOAL #1   Title Patient will return to highest level of independence in all B/IADL tasks.   Time 8   Period Weeks   Status Partially Met   OT LONG TERM GOAL #2   Title Patient will increase AROM to WNL to increase ability to reach items above head height   Time 8   Period Weeks   Status Achieved   OT LONG TERM GOAL #3   Title Patient will decrease pain to 2/10 while completing daily activities.    Time 8   Period Weeks   Status Achieved   OT LONG TERM GOAL #4   Title Patient will decrease fascial restrictions from mod to min amount.   Time 8   Period Weeks   Status Achieved   OT LONG TERM GOAL #5   Title Patient will increase strength to 5/5 to increase ability to use right arm during work tasks, including reaching high objects and lifting heavy objects   Time 8   Period Weeks   Status Partially Met          Plan - 11/28/2014 1558    Clinical Impression Statement A: Reassessment and discharge completed this  date. Patient has met all short-term goals, 3/5 long-term goals, and partially met 2/5 long-term goals. Patient reports he went back to work today and it went well. Patient reports he continues to have some muscle fatigue and decreased strength during daily and work tasks. Patient was provided with green theraband HEP to continue strengthening exercises. Patient is agreeable with discharge.    Plan Discharge patient   OT Home Exercise Plan Green Theraband HEP    Consulted and Agree with Plan of Care Patient          G-Codes - 11-28-2014 1616    Functional Assessment Tool Used FOTO 78/100 ( 22% impaired)    Functional Limitation Carrying, moving and handling objects   Carrying, Moving and Handling Objects Current Status (L2440) At least 20 percent but less than 40 percent impaired, limited or restricted   Carrying, Moving and Handling Objects Goal Status (N0272) At least 1 percent but less than 20 percent impaired, limited or restricted   Carrying, Moving and Handling Objects Discharge Status [id number]) At least 20 percent but less than 40 percent impaired, limited or restricted      Problem List Patient Active Problem List   Diagnosis Date Noted  . Pain in joint, shoulder region 08/30/2014  . Muscle weakness (generalized) 08/30/2014  . Decreased range of motion of right shoulder 08/30/2014  . 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        Ailene Ravel, OTR/L,CBIS   2014/11/28, 4:17 PM

## 2014-11-07 ENCOUNTER — Encounter (HOSPITAL_COMMUNITY): Payer: Self-pay

## 2014-11-07 NOTE — Therapy (Signed)
  Patient Details  Name: Rodney Brown MRN: [id number] Date of Birth: May 22, 1991  Encounter Date: 11/07/2014 OCCUPATIONAL THERAPY DISCHARGE SUMMARY  Visits from Start of Care: 14  Current functional level related to goals / functional outcomes: OT LONG TERM GOAL #1    Title Patient will return to highest level of independence in all B/IADL tasks.   Time 8   Period Weeks   Status Partially Met   OT LONG TERM GOAL #2   Title Patient will increase AROM to WNL to increase ability to reach items above head height   Time 8   Period Weeks   Status Achieved   OT LONG TERM GOAL #3   Title Patient will decrease pain to 2/10 while completing daily activities.    Time 8   Period Weeks   Status Achieved   OT LONG TERM GOAL #4   Title Patient will decrease fascial restrictions from mod to min amount.   Time 8   Period Weeks   Status Achieved   OT LONG TERM GOAL #5   Title Patient will increase strength to 5/5 to increase ability to use right arm during work tasks, including reaching high objects and lifting heavy objects   Time 8   Period Weeks   Status Partially Met      Remaining deficits: Patient has met all short-term goals, 3/5 long-term goals, and partially met 2/5 long-term goals. Patient reports he went back to work today and it went well. Patient reports he continues to have some muscle fatigue and decreased strength during daily and work tasks. Patient was provided with green theraband HEP to continue strengthening exercises. Patient is agreeable with discharge.    Education / Equipment: Marga Hoots HEP Plan: Patient agrees to discharge.  Patient goals were met. Patient is being discharged due to meeting the stated rehab goals.  ?????       Ailene Ravel, OTR/L,CBIS  [phone]  11/07/2014, 4:41 PM

## 2016-03-16 ENCOUNTER — Ambulatory Visit (INDEPENDENT_AMBULATORY_CARE_PROVIDER_SITE_OTHER): Payer: PRIVATE HEALTH INSURANCE | Admitting: Family Medicine

## 2016-03-16 ENCOUNTER — Encounter: Payer: Self-pay | Admitting: Family Medicine

## 2016-03-16 VITALS — BP 120/75 | HR 64 | Temp 98.3°F | Resp 20 | Ht 67.0 in | Wt 170.8 lb

## 2016-03-16 DIAGNOSIS — M25562 Pain in left knee: Secondary | ICD-10-CM

## 2016-03-16 DIAGNOSIS — Z7189 Other specified counseling: Secondary | ICD-10-CM | POA: Diagnosis not present

## 2016-03-16 DIAGNOSIS — Z7689 Persons encountering health services in other specified circumstances: Secondary | ICD-10-CM

## 2016-03-16 MED ORDER — NAPROXEN 500 MG PO TABS: 500.0000 mg | ORAL_TABLET | Freq: Two times a day (BID) | ORAL | Status: DC

## 2016-03-16 NOTE — Patient Instructions (Signed)
Knee Effusion Knee effusion means that you have excess fluid in your knee joint. This can cause pain and swelling in your knee. This may make your knee more difficult to bend and move. That is because there is increased pain and pressure in the joint. If there is fluid in your knee, it often means that something is wrong inside your knee, such as severe arthritis, abnormal inflammation, or an infection. Another common cause of knee effusion is an injury to the knee muscles, ligaments, or cartilage. HOME CARE INSTRUCTIONS  Use crutches as directed by your health care provider.  Wear a knee brace as directed by your health care provider.  Apply ice to the swollen area:  Put ice in a plastic bag.  Place a towel between your skin and the bag.  Leave the ice on for 20 minutes, 2-3 times per day.  Keep your knee raised (elevated) when you are sitting or lying down.  Take medicines only as directed by your health care provider.  Do any rehabilitation or strengthening exercises as directed by your health care provider.  Rest your knee as directed by your health care provider. You may start doing your normal activities again when your health care provider approves.   Keep all follow-up visits as directed by your health care provider. This is important. SEEK MEDICAL CARE IF:  You have ongoing (persistent) pain in your knee. SEEK IMMEDIATE MEDICAL CARE IF:  You have increased swelling or redness of your knee.  You have severe pain in your knee.  You have a fever.   This information is not intended to replace advice given to you by your health care provider. Make sure you discuss any questions you have with your health care provider.   Document Released: 02/27/2004 Document Revised: 12/28/2014 Document Reviewed: 07/23/2014 Elsevier Interactive Patient Education 2016 [employer]  Naproxen every 12 hours for for 7 days at least, with food.  Get a knee pad to use with padding and  compression.  Use above home therapy to help with discomfort. If worsening will need to sooner, if no improvement in 2 weeks will need to see again and consider xrays or referral to orthopedics.
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
## 2016-03-17 ENCOUNTER — Encounter: Payer: Self-pay | Admitting: Family Medicine

## 2016-04-22 ENCOUNTER — Telehealth: Payer: Self-pay | Admitting: Family Medicine

## 2016-04-22 ENCOUNTER — Encounter: Payer: Self-pay | Admitting: Family Medicine

## 2016-04-22 ENCOUNTER — Ambulatory Visit (INDEPENDENT_AMBULATORY_CARE_PROVIDER_SITE_OTHER): Payer: PRIVATE HEALTH INSURANCE | Admitting: Family Medicine

## 2016-04-22 VITALS — BP 127/87 | HR 57 | Temp 98.1°F | Resp 16 | Ht 67.0 in | Wt 177.0 lb

## 2016-04-22 DIAGNOSIS — T148 Other injury of unspecified body region: Secondary | ICD-10-CM | POA: Diagnosis not present

## 2016-04-22 DIAGNOSIS — W57XXXA Bitten or stung by nonvenomous insect and other nonvenomous arthropods, initial encounter: Secondary | ICD-10-CM | POA: Diagnosis not present

## 2016-04-22 DIAGNOSIS — M25562 Pain in left knee: Secondary | ICD-10-CM

## 2016-04-22 DIAGNOSIS — G44209 Tension-type headache, unspecified, not intractable: Secondary | ICD-10-CM | POA: Diagnosis not present

## 2016-04-22 MED ORDER — DOXYCYCLINE HYCLATE 100 MG PO TABS: 100.0000 mg | ORAL_TABLET | Freq: Two times a day (BID) | ORAL | Status: DC

## 2016-04-22 NOTE — Progress Notes (Signed)
Pre visit review using our clinic review tool, if applicable. No additional management support is needed unless otherwise documented below in the visit note. 
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
## 2016-04-22 NOTE — Telephone Encounter (Signed)
Patient called the office after his visit today upset that his knee pain was not addressed.  I explained that when he scheduled the appointment he did not mention knee pain to the scheduler so allotted enough time to evaluate his potential spider bite. I asked why he did not follow Dr Alan RipperKuneff's instructions to follow up in two weeks and he explained that he has been busy with work and he is having financial issues.  I also explained that Dr Milinda CaveMcGowen was following the protocol that Dr Claiborne BillingsKuneff had set in motion for him and that any pain medication prescribed was at Dr Verdis FredericksonMcGowens discretion since he has not seen the patient for knee pain. The patient asked to let Dr Milinda CaveMcGowen know that the naproxen was not helping his pain and he agreed to go for an xray if he could be billed.

## 2016-04-23 NOTE — Telephone Encounter (Signed)
OK, knee x-ray ordered for [facility].

## 2016-04-23 NOTE — Telephone Encounter (Signed)
Patient is going to [facility] for a x-ray

## 2016-04-27 ENCOUNTER — Encounter (HOSPITAL_COMMUNITY): Payer: Self-pay | Admitting: Emergency Medicine

## 2016-04-27 ENCOUNTER — Emergency Department (HOSPITAL_COMMUNITY)
Admission: EM | Admit: 2016-04-27 | Discharge: 2016-04-28 | Disposition: A | Discharge status: Home / Self Care | Payer: PRIVATE HEALTH INSURANCE | Attending: Emergency Medicine | Admitting: Emergency Medicine

## 2016-04-27 DIAGNOSIS — F17210 Nicotine dependence, cigarettes, uncomplicated: Secondary | ICD-10-CM | POA: Insufficient documentation

## 2016-04-27 DIAGNOSIS — G43909 Migraine, unspecified, not intractable, without status migrainosus: Secondary | ICD-10-CM | POA: Diagnosis not present

## 2016-04-27 DIAGNOSIS — G43009 Migraine without aura, not intractable, without status migrainosus: Secondary | ICD-10-CM

## 2016-04-27 NOTE — ED Notes (Signed)
Headache started tonight around 1900. Pt took ibuprofen 800 mg around 2000.

## 2016-04-27 NOTE — ED Notes (Signed)
Pt has been having weekly headache, nausea, vomiting, family hx, pt states little better at the moment, at worse eye sensitivity

## 2016-04-28 ENCOUNTER — Encounter: Payer: Self-pay | Admitting: Family Medicine

## 2016-04-28 ENCOUNTER — Ambulatory Visit (INDEPENDENT_AMBULATORY_CARE_PROVIDER_SITE_OTHER): Payer: PRIVATE HEALTH INSURANCE | Admitting: Family Medicine

## 2016-04-28 VITALS — BP 116/74 | HR 68 | Temp 98.3°F | Resp 20 | Wt 171.5 lb

## 2016-04-28 DIAGNOSIS — R51 Headache: Secondary | ICD-10-CM | POA: Diagnosis not present

## 2016-04-28 DIAGNOSIS — R519 Headache, unspecified: Secondary | ICD-10-CM | POA: Insufficient documentation

## 2016-04-28 MED ORDER — PROCHLORPERAZINE MALEATE 5 MG PO TABS
10.0000 mg | ORAL_TABLET | Freq: Four times a day (QID) | ORAL | Status: DC | PRN
Start: 2016-04-28 — End: 2016-04-28
  Administered 2016-04-28: 10 mg via ORAL
  Filled 2016-04-28: qty 2

## 2016-04-28 MED ORDER — MORPHINE SULFATE (PF) 4 MG/ML IV SOLN
8.0000 mg | Freq: Once | INTRAVENOUS | Status: AC
  Administered 2016-04-28: 8 mg via INTRAMUSCULAR
  Filled 2016-04-28: qty 2

## 2016-04-28 MED ORDER — PROCHLORPERAZINE MALEATE 10 MG PO TABS: 10.0000 mg | ORAL_TABLET | Freq: Four times a day (QID) | ORAL | Status: DC | PRN

## 2016-04-28 MED ORDER — SUMATRIPTAN SUCCINATE 50 MG PO TABS: 50.0000 mg | ORAL_TABLET | ORAL | Status: DC | PRN

## 2016-04-28 MED ORDER — CYCLOBENZAPRINE HCL 5 MG PO TABS: 5.0000 mg | ORAL_TABLET | Freq: Three times a day (TID) | ORAL | Status: DC | PRN

## 2016-04-28 MED ORDER — BUTALBITAL-APAP-CAFFEINE 50-325-40 MG PO TABS: 1.0000 | ORAL_TABLET | Freq: Four times a day (QID) | ORAL | Status: AC | PRN

## 2016-04-28 NOTE — ED Notes (Signed)
Pt alert & oriented x4, stable gait. Patient given discharge instructions, paperwork & prescription(s). Patient  instructed to stop at the registration desk to finish any additional paperwork. Patient verbalized understanding. Pt left department w/ no further questions. 

## 2016-04-28 NOTE — Discharge Instructions (Signed)
Your vital signs and neurologic examination are nonacute at this time. Please see Dr. Neale BurlyFreeman at the headache clinic, or the neurology specialist of your choice concerning her headaches. Migraine Headache A migraine headache is very bad, throbbing pain on one or both sides of your head. Talk to your doctor about what things may bring on (trigger) your migraine headaches. HOME CARE  Only take medicines as told by your doctor.  Lie down in a dark, quiet room when you have a migraine.  Keep a journal to find out if certain things bring on migraine headaches. For example, write down:  What you eat and drink.  How much sleep you get.  Any change to your diet or medicines.  Lessen how much alcohol you drink.  Quit smoking if you smoke.  Get enough sleep.  Lessen any stress in your life.  Keep lights dim if bright lights bother you or make your migraines worse. GET HELP RIGHT AWAY IF:   Your migraine becomes really bad.  You have a fever.  You have a stiff neck.  You have trouble seeing.  Your muscles are weak, or you lose muscle control.  You lose your balance or have trouble walking.  You feel like you will pass out (faint), or you pass out.  You have really bad symptoms that are different than your first symptoms. MAKE SURE YOU:   Understand these instructions.  Will watch your condition.  Will get help right away if you are not doing well or get worse.   This information is not intended to replace advice given to you by your health care provider. Make sure you discuss any questions you have with your health care provider.   Document Released: 09/15/2008 Document Revised: 02/29/2012 Document Reviewed: 08/14/2013 Elsevier Interactive Patient Education [employer]2016 [employer]

## 2016-04-28 NOTE — Patient Instructions (Signed)
Lyme Disease Lyme disease is an infection that affects many parts of the body, including the skin, joints, and nervous system. CAUSES Lyme disease is caused by bacteria called Borrelia burgdorferi. You can get Lyme disease by being bitten by an infected tick. The tick must be attached to your skin for at least 36 hours to transmit the infection. Deer often carry infected ticks. RISK FACTORS  Living in or visiting [place], the [address], or the [address]  Spending time in wooded or grassy areas.  Being outdoors with exposed skin.  Failing to remove a tick from your skin within 3-4 days. SIGNS AND SYMPTOMS  A round, red rash that comes out from the center of the tick bite. This is the first sign of infection. The center of the rash may be blood colored or have tiny blisters.  Fatigue.  Headache.  Chills and fever.  General achiness.  Joint pain, often in the knee.  Swollen lymph glands. DIAGNOSIS Lyme disease is diagnosed with a medical history, physical exam, and blood test. TREATMENT The main treatment is antibiotic medicine, usually taken by mouth. The length of treatment depends on how soon after a tick bite you begin taking the medicine. In some cases, treatment is necessary for several weeks. If the infection is severe, IV antibiotics may be necessary. HOME CARE INSTRUCTIONS  Take your antibiotic medicine as directed by your health care provider. Finish the antibiotic even if you start to feel better.  You may take a probiotic in between doses of your antibiotic to help avoid stomach upset or diarrhea.  Check with your health care provider before supplementing your treatment. Many alternative therapies have not been proven and may be harmful to you.  Keep all follow-up visits as directed by your health care provider. This is important. PREVENTION Reinfection is possible with another tick bite by an infected tick. Take these precautions to prevent an  infection:  Cover your skin with light-colored clothing when outdoors in the spring and summer months.  Spray clothing and skin with bug spray. The spray should be 20-30% DEET.  Avoided wooded, grassy, and shaded areas.  Remove yard litter, brush, trash, and plants that attract deer and rodents.  Check yourself for ticks when you come indoors.  Wash clothing worn each day.  Check your pets for ticks before they come inside.  If you find a tick:  Remove it with tweezers.  Clean your hands and the bite area with rubbing alcohol or soap and water. Pregnant women should take special care to avoid tick bites because the infection can be passed along to the fetus. SEEK MEDICAL CARE IF:  You have symptoms after treatment.  You have removed a tick and want to bring it to your health care provider for testing. SEEK IMMEDIATE MEDICAL CARE IF:  You have an irregular heartbeat.  You have nerve pain.  Your face feels numb. MAKE SURE YOU:  Understand these instructions.  Will watch your condition.  Will get help right away if you are not doing well or get worse.   This information is not intended to replace advice given to you by your health care provider. Make sure you discuss any questions you have with your health care provider.   Document Released: 03/15/2001 Document Revised: 12/28/2014 Document Reviewed: 04/24/2014 Elsevier Interactive Patient Education 2016 [employer].  Migraine Headache A migraine headache is an intense, throbbing pain on one or both sides of your head. A migraine can last for  30 minutes to several hours. CAUSES  The exact cause of a migraine headache is not always known. However, a migraine may be caused when nerves in the brain become irritated and release chemicals that cause inflammation. This causes pain. Certain things may also trigger migraines, such as:  Alcohol.  Smoking.  Stress.  Menstruation.  Aged cheeses.  Foods or drinks that  contain nitrates, glutamate, aspartame, or tyramine.  Lack of sleep.  Chocolate.  Caffeine.  Hunger.  Physical exertion.  Fatigue.  Medicines used to treat chest pain (nitroglycerine), birth control pills, estrogen, and some blood pressure medicines. SIGNS AND SYMPTOMS  Pain on one or both sides of your head.  Pulsating or throbbing pain.  Severe pain that prevents daily activities.  Pain that is aggravated by any physical activity.  Nausea, vomiting, or both.  Dizziness.  Pain with exposure to bright lights, loud noises, or activity.  General sensitivity to bright lights, loud noises, or smells. Before you get a migraine, you may get warning signs that a migraine is coming (aura). An aura may include:  Seeing flashing lights.  Seeing bright spots, halos, or zigzag lines.  Having tunnel vision or blurred vision.  Having feelings of numbness or tingling.  Having trouble talking.  Having muscle weakness. DIAGNOSIS  A migraine headache is often diagnosed based on:  Symptoms.  Physical exam.  A CT scan or MRI of your head. These imaging tests cannot diagnose migraines, but they can help rule out other causes of headaches. TREATMENT Medicines may be given for pain and nausea. Medicines can also be given to help prevent recurrent migraines.  HOME CARE INSTRUCTIONS  Only take over-the-counter or prescription medicines for pain or discomfort as directed by your health care provider. The use of long-term narcotics is not recommended.  Lie down in a dark, quiet room when you have a migraine.  Keep a journal to find out what may trigger your migraine headaches. For example, write down:  What you eat and drink.  How much sleep you get.  Any change to your diet or medicines.  Limit alcohol consumption.  Quit smoking if you smoke.  Get 7-9 hours of sleep, or as recommended by your health care provider.  Limit stress.  Keep lights dim if bright lights  bother you and make your migraines worse. SEEK IMMEDIATE MEDICAL CARE IF:   Your migraine becomes severe.  You have a fever.  You have a stiff neck.  You have vision loss.  You have muscular weakness or loss of muscle control.  You start losing your balance or have trouble walking.  You feel faint or pass out.  You have severe symptoms that are different from your first symptoms. MAKE SURE YOU:   Understand these instructions.  Will watch your condition.  Will get help right away if you are not doing well or get worse.   This information is not intended to replace advice given to you by your health care provider. Make sure you discuss any questions you have with your health care provider.   Document Released: 12/07/2005 Document Revised: 12/28/2014 Document Reviewed: 08/14/2013 Elsevier Interactive Patient Education 2016 [employer].  Analgesic Rebound Headaches An analgesic rebound headache is a headache that returns after pain medicine (analgesic) that was taken to treat the initial headache wears off. People who suffer from tension, migraine, or cluster headaches are at risk for developing rebound headaches. Any type of primary headache can return as a rebound headache if you regularly  take analgesics more than three times a week. If the cycle of rebound headaches continues, they become chronic daily headaches.  CAUSES Analgesics frequently associated with this problem include common over-the-counter medicines like aspirin, ibuprofen, acetaminophen, sinus relief medicines, and other medicines that contain caffeine. Narcotic pain medicines are also a common cause of rebound headaches.  SIGNS AND SYMPTOMS The symptoms of rebound headaches are the same as the symptoms of your initial headache. Symptoms of specific types of headaches include: Tension headache  Pressure around the head.  Dull, aching head pain.  Pain felt over the front and sides of the head.  Tenderness  in the muscles of the head, neck and shoulders. Migraine Headache  Pulsing or throbbing pain on one or both sides of the head.  Severe pain that interferes with daily activities.  Pain that is worsened by physical activity.  Nausea, vomiting, or both.  Pain with exposure to bright light, loud noises, or strong smells.  General sensitivity to bright light, loud noises, or strong smells.  Visual changes.  Numbness of one or both arms. Cluster Headaches  Severe pain that begins in or around one eye or temple.  Redness in the eye on the same side as the pain.  Droopy or swollen eyelid.  One-sided head pain.  Nausea.  Runny nose.  Sweaty, pale facial skin.  Restlessness. DIAGNOSIS  Analgesic rebound headaches are diagnosed by reviewing your medical history. This includes the nature of your initial headaches, as well as the type of pain medicines you have been using to treat your headaches and how often you take them. TREATMENT Discontinuing frequent use of the analgesic medicine will typically reduce the frequency of the rebound episodes. This may initially worsen your headaches but eventually the pain should become more manageable, less frequent, and less severe.  Seeing a headache specialists may helpful. He or she may be able to help you manage your headaches and to make sure there is not another cause of the headaches. Alternative methods of stress relief such as acupuncture, counseling, biofeedback, and massage may also be helpful. Talk with your health care provider about which alternative treatments might be good for you. HOME CARE INSTRUCTIONS Stopping the regular use of pain medicine can be difficult. Follow your health care provider's instructions carefully. Keep all of your appointments. Avoid triggers that are known to cause your primary headaches. SEEK MEDICAL CARE IF: You continue to experience headaches after following your health care provider's recommended  treatments. SEEK IMMEDIATE MEDICAL CARE IF:  You develop new headache pain.  You develop headache pain that is different than what you have experienced in the past.  You develop numbness or tingling in your arms or legs.  You develop changes in your speech or vision. MAKE SURE YOU:  Understand these instructions.  Will watch your child's condition.  Will get help right away if your child is not doing well or gets worse.   This information is not intended to replace advice given to you by your health care provider. Make sure you discuss any questions you have with your health care provider.   Document Released: 02/27/2004 Document Revised: 12/28/2014 Document Reviewed: 06/22/2013 Elsevier Interactive Patient Education 2016 Elsevier Inc[employer]   Tension Headache A tension headache is a feeling of pain, pressure, or aching that is often felt over the front and sides of the head. The pain can be dull, or it can feel tight (constricting). Tension headaches are not normally associated with nausea or vomiting, and  they do not get worse with physical activity. Tension headaches can last from 30 minutes to several days. This is the most common type of headache. CAUSES The exact cause of this condition is not known. Tension headaches often begin after stress, anxiety, or depression. Other triggers may include:  Alcohol.  Too much caffeine, or caffeine withdrawal.  Respiratory infections, such as colds, flu, or sinus infections.  Dental problems or teeth clenching.  Fatigue.  Holding your head and neck in the same position for a long period of time, such as while using a computer.  Smoking. SYMPTOMS Symptoms of this condition include:  A feeling of pressure around the head.  Dull, aching head pain.  Pain felt over the front and sides of the head.  Tenderness in the muscles of the head, neck, and shoulders. DIAGNOSIS This condition may be diagnosed based on your symptoms and a  physical exam. Tests may be done, such as a CT scan or an MRI of your head. These tests may be done if your symptoms are severe or unusual. TREATMENT This condition may be treated with lifestyle changes and medicines to help relieve symptoms. HOME CARE INSTRUCTIONS Managing Pain  Take over-the-counter and prescription medicines only as told by your health care provider.  Lie down in a dark, quiet room when you have a headache.  If directed, apply ice to the head and neck area:  Put ice in a plastic bag.  Place a towel between your skin and the bag.  Leave the ice on for 20 minutes, 2-3 times per day.  Use a heating pad or a hot shower to apply heat to the head and neck area as told by your health care provider. Eating and Drinking  Eat meals on a regular schedule.  Limit alcohol use.  Decrease your caffeine intake, or stop using caffeine. General Instructions  Keep all follow-up visits as told by your health care provider. This is important.  Keep a headache journal to help find out what may trigger your headaches. For example, write down:  What you eat and drink.  How much sleep you get.  Any change to your diet or medicines.  Try massage or other relaxation techniques.  Limit stress.  Sit up straight, and avoid tensing your muscles.  Do not use tobacco products, including cigarettes, chewing tobacco, or e-cigarettes. If you need help quitting, ask your health care provider.  Exercise regularly as told by your health care provider.  Get 7-9 hours of sleep, or the amount recommended by your health care provider. SEEK MEDICAL CARE IF:  Your symptoms are not helped by medicine.  You have a headache that is different from what you normally experience.  You have nausea or you vomit.  You have a fever. SEEK IMMEDIATE MEDICAL CARE IF:  Your headache becomes severe.  You have repeated vomiting.  You have a stiff neck.  You have a loss of vision.  You have  problems with speech.  You have pain in your eye or ear.  You have muscular weakness or loss of muscle control.  You lose your balance or you have trouble walking.  You feel faint or you pass out.  You have confusion.   This information is not intended to replace advice given to you by your health care provider. Make sure you discuss any questions you have with your health care provider.   Document Released: 12/07/2005 Document Revised: 08/28/2015 Document Reviewed: 04/01/2015 Elsevier Interactive Patient Education 2016 Elsevier  Inc[employer]  Cluster Headache Cluster headaches are recognized by their pattern of deep, intense head pain. They normally occur on one side of your head, but they may "switch sides" in subsequent episodes. Typically, cluster headaches:   Are severe in nature.   Occur repeatedly over weeks to months and are followed by periods of no headaches.   Can last from 15 minutes to 3 hours.   Occur at the same time each day, often at night.   Occur several times a day. CAUSES The exact cause of cluster headaches is not known. Alcohol use may be associated with cluster headaches. SIGNS AND SYMPTOMS   Severe pain that begins in or around your eye or temple.   One-sided head pain.   Feeling sick to your stomach (nauseous).   Sensitivity to light.   Runny nose.   Eye redness, tearing, and nasal stuffiness on the side of your head where you are experiencing pain.   Sweaty, pale skin of the face.   Droopy or swollen eyelid.   Restlessness. DIAGNOSIS  Cluster headaches are diagnosed based on symptoms and a physical exam. Your health care provider may order a CT scan or an MRI of your head or lab tests to see if your headaches are caused by other medical conditions.  TREATMENT   Medicines for pain relief and to prevent recurrent attacks. Some people may need a combination of medicines.  Oxygen for pain relief.   Biofeedback programs to help  reduce headache pain.  It may be helpful to keep a headache diary. This may help you find a trend for what is triggering your headaches. Your health care provider can develop a treatment plan.  HOME CARE INSTRUCTIONS  During cluster periods:   Follow a regular sleep schedule. Do not vary the amount and time that you sleep from day to day. It is important to stay on the same schedule during a cluster period to help prevent headaches.   Avoid alcohol.   Stop smoking if you smoke.  SEEK MEDICAL CARE IF:  You have any changes from your previous cluster headaches either in intensity or frequency.   You are not getting relief from medicines you are taking.  SEEK IMMEDIATE MEDICAL CARE IF:   You faint.   You have weakness or numbness, especially on one side of your body or face.   You have double vision.   You have nausea or vomiting that is not relieved within several hours.   You cannot keep your balance or have difficulty talking or walking.   You have neck pain or stiffness.   You have a fever. MAKE SURE YOU:  Understand these instructions.   Will watch your condition.   Will get help right away if you are not doing well or get worse.   This information is not intended to replace advice given to you by your health care provider. Make sure you discuss any questions you have with your health care provider.   Document Released: 12/07/2005 Document Revised: 09/27/2013 Document Reviewed: 06/29/2013 Elsevier Interactive Patient Education 2016 [employer] Inc[employer]   Flexeril for tension headache. Imitrex for when you feel a headache coming on, take and lay down if able. May repeat dose only once (total of 2 doses only). Fioricet for pain only, would not recommend taking this frequently.  Read the above information on the different type of headaches.

## 2016-04-28 NOTE — ED Provider Notes (Signed)
CSN: [id number]     Arrival date & time 04/27/16  2038 History   First MD Initiated Contact with Patient 04/27/16 2351     Chief Complaint  Patient presents with  . Migraine     (Consider location/radiation/quality/duration/timing/severity/associated sxs/prior Treatment) Patient is a 25 y.o. male presenting with migraines. The history is provided by the patient.  Migraine This is a recurrent problem. The current episode started today. The problem occurs intermittently. The problem has been gradually worsening. Associated symptoms include headaches. Pertinent negatives include no chills, fever, myalgias, numbness, sore throat or weakness. Associated symptoms comments: Light sensativity. Exacerbated by: bright lights. He has tried NSAIDs for the symptoms. The treatment provided moderate relief.    Past Medical History  Diagnosis Date  . 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 Rash of neck 06/26/2014  . 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 Shoulder surgery Right     x 2   Family History  Problem Relation Age of Onset  . Stroke Father    Social History  Substance Use Topics  . Smoking status: Current Every Day Smoker -- 0.50 packs/day for 4 years    Types: Cigarettes  . Smokeless tobacco: Never Used  . Alcohol Use: Yes     Comment: occasionally    Review of Systems  Constitutional: Negative for fever and chills.  HENT: Negative for sore throat.   Musculoskeletal: Negative for myalgias.  Neurological: Positive for headaches. Negative for dizziness, seizures, syncope, weakness and numbness.  All other systems reviewed and are negative.     Allergies  Review of patient's allergies indicates no known allergies.  Home Medications   Prior to Admission medications   Medication Sig Start Date End Date Taking? Authorizing Provider  doxycycline (VIBRA-TABS) 100 MG tablet Take 1 tablet (100 mg total) by  mouth 2 (two) times daily. 04/22/16   Jeoffrey MassedPhilip H McGowen, MD  naproxen (NAPROSYN) 500 MG tablet Take 1 tablet (500 mg total) by mouth 2 (two) times daily with a meal. 03/16/16   Renee A Kuneff, DO   BP 132/84 mmHg  Pulse 58  Temp(Src) 98.4 F (36.9 C) (Oral)  Resp 18  Ht 5\' 7"  (1.702 m)  Wt 79.379 kg  BMI 27.40 kg/m2  SpO2 98% Physical Exam  Constitutional: He is oriented to person, place, and time. He appears well-developed and well-nourished.  Non-toxic appearance.  HENT:  Head: Normocephalic.  Right Ear: Tympanic membrane and external ear normal.  Left Ear: Tympanic membrane and external ear normal.  Eyes: EOM and lids are normal. Pupils are equal, round, and reactive to light.  Neck: Normal range of motion. Neck supple. Carotid bruit is not present.  Cardiovascular: Normal rate, regular rhythm, normal heart sounds, intact distal pulses and normal pulses.   Pulmonary/Chest: Breath sounds normal. No respiratory distress.  Abdominal: Soft. Bowel sounds are normal. There is no tenderness. There is no guarding.  Musculoskeletal: Normal range of motion.  Lymphadenopathy:       Head (right side): No submandibular adenopathy present.       Head (left side): No submandibular adenopathy present.    He has no cervical adenopathy.  Neurological: He is alert and oriented to person, place, and time. He has normal strength. No cranial nerve deficit or sensory deficit. He exhibits normal muscle tone. Coordination normal.  Gait is steady. Speech clear. No problem with balance.No gross neuro deficit noted.  Skin: Skin  is warm and dry.  Psychiatric: He has a normal mood and affect. His speech is normal.  Nursing note and vitals reviewed.   ED Course  Procedures (including critical care time) Labs Review Labs Reviewed - No data to display  Imaging Review No results found. I have personally reviewed and evaluated these images and lab results as part of my medical decision-making.   EKG  Interpretation None      MDM  Vital signs stable. No gross neuro deficit noted. Headache improved after ibuprofen at home. Pt conversing with family and drinking in ED without problem. Rx for Fioricet given to the patient. Pt to follow up with neurology, or [facility]onal [facility].   Final diagnoses:  Migraine without aura and without status migrainosus, not intractable    **I have reviewed nursing notes, vital signs, and all appropriate lab and imaging results for this patient.Ivery Quale, PA-C 04/29/16 2103  Devoria Albe, MD 05/18/16 Mikle Bosworth

## 2016-04-29 NOTE — Progress Notes (Signed)
Patient ID: Rodney Brown, male   DOB: Aug 12, 1991, 25 y.o.   MRN: [id number]    Rodney Brown , Aug 12, 1991, 25 y.o., male MRN: [id number]  CC: Frequent headaches Subjective: Pt presents for an OV after emergency room visit for severe headache. Patient is accompanied by his mother. Patient was seen in the emergency room for left-sided headache that involved his eye to his neck. Patient states he has a history of headaches when he was younger, and it was thought to be possibly secondary to the fluorescent lighting. He has had a vision test within the last year without any need of correction. Patient states when he had this headache that sent him to the emergency room, he experienced  mild blurred vision, his left eye became watery and he experienced mild nausea. Patient denies vomiting. He states he sometimes does have light sensitivity with his headaches. His headaches occur routinely either frontal or left-sided. He does not think that his eye waters every time he has a headache. His headaches have increased in intensity and frequency over the last few weeks. He reports this occurs sometimes, and then they will go away for months at a time. His father has a history of migraines and early stroke. Patient has tried over-the-counter regimens, that were ineffective. Of note he did have a insect bites that have the potential being a tick bite prior to onset of headaches last week. He is a smoker. There are no imaging studies or labs to review from his emergency room visit. Patient does not currently have a headache, but does fill neck tightness.  No Known Allergies Social History  Substance Use Topics  . Smoking status: Current Every Day Smoker -- 0.50 packs/day for 4 years    Types: Cigarettes  . Smokeless tobacco: Never Used  . Alcohol Use: Yes     Comment: occasionally   Past Medical History  Diagnosis Date  . 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 Rash of neck 06/26/2014  . 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 Migraines    Past Surgical History  Procedure Laterality Date  . Shoulder surgery Right     x 2   Family History  Problem Relation Age of Onset  . Stroke Father      Medication List       This list is accurate as of: 04/28/16 11:59 PM.  Always use your most recent med list.               butalbital-acetaminophen-caffeine 50-325-40 MG tablet  Commonly known as:  FIORICET  Take 1-2 tablets by mouth every 6 (six) hours as needed for headache.     cyclobenzaprine 5 MG tablet  Commonly known as:  FLEXERIL  Take 1 tablet (5 mg total) by mouth 3 (three) times daily as needed for muscle spasms.     doxycycline 100 MG tablet  Commonly known as:  VIBRA-TABS  Take 1 tablet (100 mg total) by mouth 2 (two) times daily.     prochlorperazine 10 MG tablet  Commonly known as:  COMPAZINE  Take 1 tablet (10 mg total) by mouth every 6 (six) hours as needed for nausea or vomiting.     SUMAtriptan 50 MG tablet  Commonly known as:  IMITREX  Take 1 tablet (50 mg total) by mouth every 2 (two) hours as needed for migraine. May repeat in 2 hours if headache persists or recurs.        ROS:  Negative, with the exception of above mentioned in HPI  Objective:  BP 116/74 mmHg  Pulse 68  Temp(Src) 98.3 F (36.8 C)  Resp 20  Wt 171 lb 8 oz (77.792 kg)  SpO2 99% Body mass index is 26.85 kg/(m^2). Gen: Afebrile. No acute distress. Nontoxic in appearance. Well developed, well-nourished Caucasian male. Pleasant. HENT: AT. [place]. MMM, no oral lesions. Eyes:Pupils Equal Round Reactive to light, Extraocular movements intact,  Conjunctiva without redness, discharge or icterus. Neck/lymp/endocrine: Supple, no adenopathy. Full range of motion, mild tenderness to palpation just below occipital tubercle left greater than right.  Neuro: Normal gait. PERLA. EOMi. Alert. Oriented x3  Psych: Normal affect, dress and demeanor. Normal speech. Normal thought content and  judgment.  Assessment/Plan: Rodney Brown is a 25 y.o. male present for acute OV for  1. Frequent headaches - Discussed with patient his mother there are multiple different types of headaches, and by his description he does not classically fit into any of them specifically but favoring tension or cluster headache. Patient was given AVS information on migraine, tension, cluster, rebound headaches for his review/education. - He has had an updated eye exam within the last year. - Discussed abortive treatment of headaches versus pain treatment of headaches. Patient was given a prescription of Imitrex for him to try when he feels the onset of headache. He was educated on the proper use of Imitrex and counseled that this should not be needed daily or even frequently. Discussed Flexeril use to help relieve tension. Patient was encouraged to first attempt to use this tonight before bed, and cautioned concerning sedating properties. Patient was prescribed furosemide from the emergency room physician, discussed with him this is for pain control, and also should be used with caution in moderate to severe pain if NSAIDs do not control the pain first.  - cyclobenzaprine (FLEXERIL) 5 MG tablet; Take 1 tablet (5 mg total) by mouth 3 (three) times daily as needed for muscle spasms.  Dispense: 30 tablet; Refill: 1 - SUMAtriptan (IMITREX) 50 MG tablet; Take 1 tablet (50 mg total) by mouth every 2 (two) hours as needed for migraine. May repeat in 2 hours if headache persists or recurs.  Dispense: 10 tablet; Refill: 0 - Patient and mother where he will need to follow-up in 4 weeks, sooner if above regimens aren't working or headaches are worsening in intensity and frequency. Discuss with him will need to do a further workup including imaging studies and labs if headaches are worsening. Could consider headache clinic versus neurology referral.  > 25 minutes spent with patient, >50% of time spent face to face counseling  patient and coordinating care.   electronically signed by:  Felix Pacini, DO  [facility] - [place]

## 2016-05-26 ENCOUNTER — Encounter: Payer: Self-pay | Admitting: Family Medicine

## 2016-05-26 ENCOUNTER — Ambulatory Visit (INDEPENDENT_AMBULATORY_CARE_PROVIDER_SITE_OTHER): Payer: PRIVATE HEALTH INSURANCE | Admitting: Family Medicine

## 2016-05-26 VITALS — BP 117/75 | HR 65 | Temp 98.3°F | Resp 18 | Wt 169.8 lb

## 2016-05-26 DIAGNOSIS — R51 Headache: Secondary | ICD-10-CM | POA: Diagnosis not present

## 2016-05-26 DIAGNOSIS — R519 Headache, unspecified: Secondary | ICD-10-CM

## 2016-05-26 NOTE — Patient Instructions (Signed)
Continue the flexeril at night if needed. Try to stop using imitrex. Use "caffeine pill" only when needed. Try caffeine in the afternoon instead.  I am glad your headaches are better.

## 2016-05-26 NOTE — Progress Notes (Signed)
Patient ID: Rodney Brown, male   DOB: Apr 30, 1991, 25 y.o.   MRN: [id number]    Rodney Brown , July 07, 1991, 25 y.o., male MRN: [id number]  CC: Frequent headaches follow up Subjective:  Frequent Headaches: Pt presents 10 minutes late today for his 15 minute follo wup appt on headaches. He is very Tax adviser. He reports his headaches are much improved. He states he has had to use the imitrex on a few occasions over the lst month, but one dose works well. He also has been taking the flexeril at night and feels that has help him greatly. He endorses most headaches that occuring are at the end of the day now. He takes his "caffeine" pill and he feels better. He is referring to the [place].   Pt reports he does have something he wants to talk about if we have time. He has noticed he has less desire to do activities and does not feel he is as "happy" as he used to be. He states his girlfriend has noticed it as well. He reports he has no energy or desire to be social or motivated to perform activities he use to enjoy (washing four-wheeler etc). He does not think he is depressed but just talking about now he is feeling a little better.    Prior note: Pt presents for an OV after emergency room visit for severe headache. Patient is accompanied by his mother. Patient was seen in the emergency room for left-sided headache that involved his eye to his neck. Patient states he has a history of headaches when he was younger, and it was thought to be possibly secondary to the fluorescent lighting. He has had a vision test within the last year without any need of correction. Patient states when he had this headache that sent him to the emergency room, he experienced  mild blurred vision, his left eye became watery and he experienced mild nausea. Patient denies vomiting. He states he sometimes does have light sensitivity with his headaches. His headaches occur routinely either frontal or left-sided. He does not think that  his eye waters every time he has a headache. His headaches have increased in intensity and frequency over the last few weeks. He reports this occurs sometimes, and then they will go away for months at a time. His father has a history of migraines and early stroke. Patient has tried over-the-counter regimens, that were ineffective. Of note he did have a insect bites that have the potential being a tick bite prior to onset of headaches last week. He is a smoker. There are no imaging studies or labs to review from his emergency room visit. Patient does not currently have a headache, but does fill neck tightness.  No Known Allergies Social History  Substance Use Topics  . Smoking status: Current Every Day Smoker -- 0.50 packs/day for 4 years    Types: Cigarettes  . Smokeless tobacco: Never Used  . Alcohol Use: Yes     Comment: occasionally   Past Medical History  Diagnosis Date  . 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 Rash of neck 06/26/2014  . 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 Migraines    Past Surgical History  Procedure Laterality Date  . Shoulder surgery Right     x 2   Family History  Problem Relation Age of Onset  . Stroke Father      Medication List       This list is  accurate as of: 05/26/16  4:07 PM.  Always use your most recent med list.               butalbital-acetaminophen-caffeine 50-325-40 MG tablet  Commonly known as:  FIORICET  Take 1-2 tablets by mouth every 6 (six) hours as needed for headache.     cyclobenzaprine 5 MG tablet  Commonly known as:  FLEXERIL  Take 1 tablet (5 mg total) by mouth 3 (three) times daily as needed for muscle spasms.     prochlorperazine 10 MG tablet  Commonly known as:  COMPAZINE  Take 1 tablet (10 mg total) by mouth every 6 (six) hours as needed for nausea or vomiting.     SUMAtriptan 50 MG tablet  Commonly known as:  IMITREX  Take 1 tablet (50 mg total) by mouth every 2 (two) hours as needed for  migraine. May repeat in 2 hours if headache persists or recurs.        ROS: Negative, with the exception of above mentioned in HPI  Objective:  BP 117/75 mmHg  Pulse 65  Temp(Src) 98.3 F (36.8 C) (Oral)  Resp 18  Wt 169 lb 12.8 oz (77.021 kg)  SpO2 95% Body mass index is 26.59 kg/(m^2). Gen: Afebrile. No acute distress. Nontoxic in appearance. Well developed, well-nourished Caucasian male. Pleasant. HENT: AT. [place]. MMM, no oral lesions. Eyes:Pupils Equal Round Reactive to light, Extraocular movements intact,  Conjunctiva without redness, discharge or icterus. Neck/lymp/endocrine: Supple, no adenopathy. Full range of motion, mild tenderness to palpation just below occipital tubercle left greater than right.  Neuro: Normal gait. PERLA. EOMi. Alert. Oriented x3  Psych: Normal affect, dress and demeanor. Normal speech. Normal thought content and judgment.  Assessment/Plan: Rodney Brown is a 25 y.o. male present for follow OV for  Frequent headaches - pt advised to cut back on firocet and only use if nsaids not effective and it is truly a headache. If it is decreased energy more than a headache, he may need to drink some minimal caffeine/snack.  - Flexeril can be continued PRN. - Do not use imitrex unless not controlled by NSAIDS first. - Pt asked to make appt to discuss in detail his NEW compliant that sounds like either depression or possible fatigue. Advised front office this should be a 28 m appt for new depression/fatigue evaluation.   electronically signed by:  Howard Pouch, DO  [place]

## 2016-05-29 ENCOUNTER — Encounter: Payer: Self-pay | Admitting: Family Medicine

## 2016-05-29 ENCOUNTER — Ambulatory Visit (INDEPENDENT_AMBULATORY_CARE_PROVIDER_SITE_OTHER): Payer: PRIVATE HEALTH INSURANCE | Admitting: Family Medicine

## 2016-05-29 VITALS — BP 124/77 | HR 72 | Temp 98.7°F | Resp 16 | Ht 67.0 in | Wt 173.8 lb

## 2016-05-29 DIAGNOSIS — R51 Headache: Secondary | ICD-10-CM

## 2016-05-29 DIAGNOSIS — F418 Other specified anxiety disorders: Secondary | ICD-10-CM | POA: Diagnosis not present

## 2016-05-29 DIAGNOSIS — R5383 Other fatigue: Secondary | ICD-10-CM | POA: Insufficient documentation

## 2016-05-29 DIAGNOSIS — R519 Headache, unspecified: Secondary | ICD-10-CM

## 2016-05-29 HISTORY — DX: Other specified anxiety disorders: F41.8

## 2016-05-29 LAB — CBC WITH DIFFERENTIAL/PLATELET
Basophils Absolute: 0 cells/uL (ref 0–200)
Basophils Relative: 0 %
Eosinophils Absolute: 210 cells/uL (ref 15–500)
Eosinophils Relative: 3 %
HEMATOCRIT: 43.1 % (ref 38.5–50.0)
Hemoglobin: 15.1 g/dL (ref 13.2–17.1)
LYMPHS PCT: 44 %
Lymphs Abs: 3080 cells/uL (ref 850–3900)
MCH: 31 pg (ref 27.0–33.0)
MCHC: 35 g/dL (ref 32.0–36.0)
MCV: 88.5 fL (ref 80.0–100.0)
MONO ABS: 350 {cells}/uL (ref 200–950)
MPV: 10.1 fL (ref 7.5–12.5)
Monocytes Relative: 5 %
NEUTROS PCT: 48 %
Neutro Abs: 3360 cells/uL (ref 1500–7800)
PLATELETS: 249 10*3/uL (ref 140–400)
RBC: 4.87 MIL/uL (ref 4.20–5.80)
RDW: 13.2 % (ref 11.0–15.0)
WBC: 7 10*3/uL (ref 3.8–10.8)

## 2016-05-29 LAB — COMPREHENSIVE METABOLIC PANEL
ALT: 17 U/L (ref 9–46)
AST: 21 U/L (ref 10–40)
Albumin: 4.7 g/dL (ref 3.6–5.1)
Alkaline Phosphatase: 52 U/L (ref 40–115)
BUN: 15 mg/dL (ref 7–25)
CALCIUM: 10 mg/dL (ref 8.6–10.3)
CO2: 25 mmol/L (ref 20–31)
Chloride: 104 mmol/L (ref 98–110)
Creat: 1.11 mg/dL (ref 0.60–1.35)
GLUCOSE: 85 mg/dL (ref 65–99)
POTASSIUM: 4.3 mmol/L (ref 3.5–5.3)
Sodium: 140 mmol/L (ref 135–146)
Total Bilirubin: 0.8 mg/dL (ref 0.2–1.2)
Total Protein: 7 g/dL (ref 6.1–8.1)

## 2016-05-29 LAB — VITAMIN B12: VITAMIN B 12: 548 pg/mL (ref 200–1100)

## 2016-05-29 LAB — TSH: TSH: 0.93 mIU/L (ref 0.40–4.50)

## 2016-05-29 MED ORDER — VENLAFAXINE HCL ER 37.5 MG PO CP24: 37.5000 mg | ORAL_CAPSULE | Freq: Every day | ORAL | Status: DC

## 2016-05-29 MED ORDER — VENLAFAXINE HCL ER 75 MG PO CP24: 75.0000 mg | ORAL_CAPSULE | Freq: Every day | ORAL | Status: DC

## 2016-05-29 NOTE — Patient Instructions (Signed)
37.5 mg dose called in today. Take every day for 7 days.  On day 8 start the 75 mg dose.  I need to see you on 4 weeks to see how you are doing.

## 2016-05-29 NOTE — Progress Notes (Signed)
Patient ID: Rodney Brown, male   DOB: [birth year], 25 y.o.   MRN: [id number]    Rodney Brown , 04-Jan-1991, 25 y.o., male MRN: [id number]  CC: depression/anxiety Subjective:   Depression/anxiety: Pt reports he has noticed he has less desire to do activities he had enjoyed and does not feel he is as "happy" as he used to be. He states his girlfriend has noticed the changes  as well. He reports he has no energy or desire to be social or motivated to perform activities he use to enjoy (washing four-wheeler etc). He does not think he is depressed, but uncertain. He endorses increased fatigue. He states he  tosses and turn thoughout the night, falls asleep quickly, had good sleep hygiene by report. He states he relies on energy drinks to get him going in the morning. He states his Girlfriend does complain of him snoring, and sometimes "holding his breath". He has been having afternoon headaches and finds himself needing caffeine. Mood disorder screen: negative Depression screen PHQ 2/9 06/01/2016  Decreased Interest 1  Down, Depressed, Hopeless 1  PHQ - 2 Score 2  Altered sleeping 1  Tired, decreased energy 2  Change in appetite 0  Feeling bad or failure about yourself  0  Trouble concentrating 1  Moving slowly or fidgety/restless 1  Suicidal thoughts 0  PHQ-9 Score 7  Difficult doing work/chores Somewhat difficult    GAD 7 : Generalized Anxiety Score 06/01/2016  Nervous, Anxious, on Edge 2  Control/stop worrying 2  Worry too much - different things 2  Trouble relaxing 2  Restless 1  Easily annoyed or irritable 2  Afraid - awful might happen 2  Total GAD 7 Score 13  Anxiety Difficulty Somewhat difficult     No Known Allergies Social History  Substance Use Topics  . Smoking status: Current Every Day Smoker -- 0.50 packs/day for 4 years    Types: Cigarettes  . Smokeless tobacco: Never Used  . Alcohol Use: Yes     Comment: occasionally   Past Medical History  Diagnosis Date  .  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 Rash of neck 06/26/2014  . 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 Migraines    Past Surgical History  Procedure Laterality Date  . Shoulder surgery Right     x 2   Family History  Problem Relation Age of Onset  . Stroke Father      Medication List       This list is accurate as of: 05/29/16  2:52 PM.  Always use your most recent med list.               butalbital-acetaminophen-caffeine 50-325-40 MG tablet  Commonly known as:  FIORICET  Take 1-2 tablets by mouth every 6 (six) hours as needed for headache.     cyclobenzaprine 5 MG tablet  Commonly known as:  FLEXERIL  Take 1 tablet (5 mg total) by mouth 3 (three) times daily as needed for muscle spasms.     prochlorperazine 10 MG tablet  Commonly known as:  COMPAZINE  Take 1 tablet (10 mg total) by mouth every 6 (six) hours as needed for nausea or vomiting.     SUMAtriptan 50 MG tablet  Commonly known as:  IMITREX  Take 1 tablet (50 mg total) by mouth every 2 (two) hours as needed for migraine. May repeat in 2 hours if headache persists or recurs.  ROS: Negative, with the exception of above mentioned in HPI  Objective:  BP 124/77 mmHg  Pulse 72  Temp(Src) 98.7 F (37.1 C) (Oral)  Resp 16  Ht '5\' 7"'$  (1.702 m)  Wt 173 lb 12 oz (78.812 kg)  BMI 27.21 kg/m2  SpO2 96% Body mass index is 27.21 kg/(m^2). Gen: Afebrile. No acute distress. Nontoxic in appearence, well developed, well nourished, pleasant male.  HENT: AT. [place].MMM, no oral lesions.  Eyes:Pupils Equal Round Reactive to light, Extraocular movements intact,  Conjunctiva without redness, discharge or icterus. Neck/lymp/endocrine: Supple,No lymphadenopathy, No thyromegaly CV: RRR Psych: Mildly anxious.Normal affect, dress and demeanor. Normal speech. Normal thought content and judgment.   Assessment/Plan: SHA AMER is a 25 y.o. male present for OV for  Other fatigue/Frequent headaches -  Discussed with pt all of the above could be symptoms caused by stress or identifiable causes of their own. Basic labs collected today. - headaches have improved with abortive therapy and muscle relaxer.  - CBC w/Diff - Comp Met (CMET) - TSH - Prolactin - VITAMIN D 25 Hydroxy (Vit-D Deficiency, Fractures) - B12 - Sed Rate (ESR)  Depression with anxiety - PHQ/GAD completed today. Effexor taper.  - Cousled on never stopping medication abruptly and taper on/off  - venlafaxine XR (EFFEXOR XR) 37.5 MG 24 hr capsule; Take 1 capsule (37.5 mg total) by mouth daily with breakfast.  Dispense: 7 capsule; Refill: 0 - venlafaxine XR (EFFEXOR XR) 75 MG 24 hr capsule; Take 1 capsule (75 mg total) by mouth daily with breakfast.  Dispense: 30 capsule; Refill: 0 - F/U 4-5 weeks.   electronically signed by:  Howard Pouch, DO  [place]

## 2016-05-29 NOTE — Progress Notes (Signed)
Pre visit review using our clinic review tool, if applicable. No additional management support is needed unless otherwise documented below in the visit note. 

## 2016-05-30 LAB — PROLACTIN: Prolactin: 4 ng/mL (ref 2.0–18.0)

## 2016-05-30 LAB — SEDIMENTATION RATE: Sed Rate: 1 mm/hr (ref 0–15)

## 2016-05-30 LAB — VITAMIN D 25 HYDROXY (VIT D DEFICIENCY, FRACTURES): Vit D, 25-Hydroxy: 36 ng/mL (ref 30–100)

## 2016-06-01 ENCOUNTER — Telehealth: Payer: Self-pay | Admitting: Family Medicine

## 2016-06-01 NOTE — Telephone Encounter (Signed)
Please call pt: - Labs are all normal.

## 2016-06-02 NOTE — Telephone Encounter (Signed)
Left message with lab results on patient voice mail 

## 2016-06-24 ENCOUNTER — Ambulatory Visit (INDEPENDENT_AMBULATORY_CARE_PROVIDER_SITE_OTHER): Payer: PRIVATE HEALTH INSURANCE | Admitting: Family Medicine

## 2016-06-24 ENCOUNTER — Encounter: Payer: Self-pay | Admitting: Family Medicine

## 2016-06-24 DIAGNOSIS — F418 Other specified anxiety disorders: Secondary | ICD-10-CM

## 2016-06-24 MED ORDER — FLUOXETINE HCL 20 MG PO TABS: 20.0000 mg | ORAL_TABLET | Freq: Every day | ORAL | Status: DC

## 2016-06-24 NOTE — Progress Notes (Signed)
Patient ID: Rodney Brown, male   DOB: 11/15/1991, 25 y.o.   MRN: [id number]    Rodney Brown , 11/15/1991, 25 y.o., male MRN: [id number]  CC: depression/anxiety Subjective:  Pt stopped his Effexor, despite counseling on not doing so. He stopped because he felt it was making him tired. He reports when he started the 37.5 mg he felt really good, but then with increased dose felt fatigued. He is dependant on caffeine and takes 5 hour energy drinks a couple times a day. He also feels he is having more mood swings. He is in a job that is making him overwhelmed.   Depression/anxiety: Pt reports he has noticed he has less desire to do activities he had enjoyed and does not feel he is as "happy" as he used to be. He states his girlfriend has noticed the changes  as well. He reports he has no energy or desire to be social or motivated to perform activities he use to enjoy (washing four-wheeler etc). He does not think he is depressed, but uncertain. He endorses increased fatigue. He states he  tosses and turn thoughout the night, falls asleep quickly, had good sleep hygiene by report. He states he relies on energy drinks to get him going in the morning. He states his Girlfriend does complain of him snoring, and sometimes "holding his breath". He has been having afternoon headaches and finds himself needing caffeine. Mood disorder screen: negative Depression screen PHQ 2/9 06/01/2016  Decreased Interest 1  Down, Depressed, Hopeless 1  PHQ - 2 Score 2  Altered sleeping 1  Tired, decreased energy 2  Change in appetite 0  Feeling bad or failure about yourself  0  Trouble concentrating 1  Moving slowly or fidgety/restless 1  Suicidal thoughts 0  PHQ-9 Score 7  Difficult doing work/chores Somewhat difficult    GAD 7 : Generalized Anxiety Score 06/01/2016  Nervous, Anxious, on Edge 2  Control/stop worrying 2  Worry too much - different things 2  Trouble relaxing 2  Restless 1  Easily annoyed or irritable  2  Afraid - awful might happen 2  Total GAD 7 Score 13  Anxiety Difficulty Somewhat difficult     No Known Allergies Social History  Substance Use Topics  . Smoking status: Current Every Day Smoker -- 0.50 packs/day for 4 years    Types: Cigarettes  . Smokeless tobacco: Never Used  . Alcohol Use: Yes     Comment: occasionally   Past Medical History  Diagnosis Date  . 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 Rash of neck 06/26/2014  . 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 Migraines    Past Surgical History  Procedure Laterality Date  . Shoulder surgery Right     x 2   Family History  Problem Relation Age of Onset  . Stroke Father      Medication List       This list is accurate as of: 06/24/16  4:10 PM.  Always use your most recent med list.               butalbital-acetaminophen-caffeine 50-325-40 MG tablet  Commonly known as:  FIORICET  Take 1-2 tablets by mouth every 6 (six) hours as needed for headache.     cyclobenzaprine 5 MG tablet  Commonly known as:  FLEXERIL  Take 1 tablet (5 mg total) by mouth 3 (three) times daily as needed for muscle spasms.     prochlorperazine  10 MG tablet  Commonly known as:  COMPAZINE  Take 1 tablet (10 mg total) by mouth every 6 (six) hours as needed for nausea or vomiting.     SUMAtriptan 50 MG tablet  Commonly known as:  IMITREX  Take 1 tablet (50 mg total) by mouth every 2 (two) hours as needed for migraine. May repeat in 2 hours if headache persists or recurs.     venlafaxine XR 75 MG 24 hr capsule  Commonly known as:  EFFEXOR XR  Take 1 capsule (75 mg total) by mouth daily with breakfast.        ROS: Negative, with the exception of above mentioned in HPI  Objective:  BP 109/73 mmHg  Pulse 66  Temp(Src) 98.5 F (36.9 C)  Resp 20  Wt 172 lb 12.8 oz (78.382 kg)  SpO2 97% Body mass index is 27.06 kg/(m^2). Gen: Afebrile. No acute distress. Nontoxic in appearence, well developed, well  nourished, pleasant male.  HENT: AT. [place].MMM, no oral lesions.  Eyes:Pupils Equal Round Reactive to light, Extraocular movements intact,  Conjunctiva without redness, discharge or icterus. Psych: Mildly anxious.Normal affect, dress and demeanor. Normal speech. Normal thought content and judgment.   Assessment/Plan: Rodney Brown is a 25 y.o. male present for OV for  Depression with anxiety - FLUoxetine (PROZAC) 20 MG tablet; Take 1 tablet (20 mg total) by mouth daily.  Dispense: 30 tablet; Refill: 1 - Long discussion: pt is indecisive on what he wants to do. Many options were presented to him today. He was not on medication long enough to provide benefit. He could have experienced drowsiness from medication but his drowsiness has been chronic.  - He decided on different therapy, so we will try prozac.   - Discussed increasing exercise as well to help with insomnia and fatigue.  - F/U 4 weeks. May need to consider therapy referral.   > 25 minutes spent with patient, >50% of time spent face to face counseling patient and coordinating care.   electronically signed by:  Felix Pacinienee Kuneff, DO  [facility] - [place]

## 2016-06-24 NOTE — Patient Instructions (Signed)
Start prozac 20 mg daily for  4 weeks. Follow up: 4 weeks.

## 2016-07-11 ENCOUNTER — Emergency Department (HOSPITAL_COMMUNITY)
Admission: EM | Admit: 2016-07-11 | Discharge: 2016-07-11 | Disposition: A | Discharge status: Home / Self Care | Payer: PRIVATE HEALTH INSURANCE | Attending: Emergency Medicine | Admitting: Emergency Medicine

## 2016-07-11 ENCOUNTER — Encounter (HOSPITAL_COMMUNITY): Payer: Self-pay | Admitting: Emergency Medicine

## 2016-07-11 DIAGNOSIS — T63441A Toxic effect of venom of bees, accidental (unintentional), initial encounter: Secondary | ICD-10-CM | POA: Insufficient documentation

## 2016-07-11 DIAGNOSIS — Z79899 Other long term (current) drug therapy: Secondary | ICD-10-CM | POA: Insufficient documentation

## 2016-07-11 DIAGNOSIS — T7840XA Allergy, unspecified, initial encounter: Secondary | ICD-10-CM

## 2016-07-11 DIAGNOSIS — F17210 Nicotine dependence, cigarettes, uncomplicated: Secondary | ICD-10-CM | POA: Diagnosis not present

## 2016-07-11 LAB — I-STAT CHEM 8, ED
BUN: 15 mg/dL (ref 6–20)
CHLORIDE: 101 mmol/L (ref 101–111)
CREATININE: 1.1 mg/dL (ref 0.61–1.24)
Calcium, Ion: 1.22 mmol/L (ref 1.13–1.30)
Glucose, Bld: 114 mg/dL — ABNORMAL HIGH (ref 65–99)
HCT: 48 % (ref 39.0–52.0)
Hemoglobin: 16.3 g/dL (ref 13.0–17.0)
POTASSIUM: 3.7 mmol/L (ref 3.5–5.1)
Sodium: 140 mmol/L (ref 135–145)
TCO2: 26 mmol/L (ref 0–100)

## 2016-07-11 MED ORDER — EPINEPHRINE 0.3 MG/0.3ML IJ SOAJ: 0.3000 mg | Freq: Once | INTRAMUSCULAR | Status: DC

## 2016-07-11 MED ORDER — METHYLPREDNISOLONE SODIUM SUCC 125 MG IJ SOLR
125.0000 mg | Freq: Once | INTRAMUSCULAR | Status: AC
  Administered 2016-07-11: 125 mg via INTRAVENOUS

## 2016-07-11 MED ORDER — EPINEPHRINE 0.3 MG/0.3ML IJ SOAJ
INTRAMUSCULAR | Status: AC
  Administered 2016-07-11: 0.3 mg via INTRAMUSCULAR
  Filled 2016-07-11: qty 0.3

## 2016-07-11 MED ORDER — PREDNISONE 20 MG PO TABS: ORAL_TABLET | ORAL | Status: DC

## 2016-07-11 MED ORDER — FAMOTIDINE IN NACL 20-0.9 MG/50ML-% IV SOLN
20.0000 mg | Freq: Once | INTRAVENOUS | Status: AC
  Administered 2016-07-11: 20 mg via INTRAVENOUS

## 2016-07-11 MED ORDER — EPINEPHRINE 0.3 MG/0.3ML IJ SOAJ
0.3000 mg | Freq: Once | INTRAMUSCULAR | Status: AC
  Administered 2016-07-11: 0.3 mg via INTRAMUSCULAR

## 2016-07-11 MED ORDER — SODIUM CHLORIDE 0.9 % IV BOLUS (SEPSIS)
1000.0000 mL | Freq: Once | INTRAVENOUS | Status: AC
  Administered 2016-07-11: 1000 mL via INTRAVENOUS

## 2016-07-11 MED ORDER — DIPHENHYDRAMINE HCL 50 MG/ML IJ SOLN
50.0000 mg | Freq: Once | INTRAMUSCULAR | Status: AC
  Administered 2016-07-11: 50 mg via INTRAVENOUS

## 2016-07-11 MED ORDER — DIPHENHYDRAMINE HCL 50 MG/ML IJ SOLN
INTRAMUSCULAR | Status: AC
  Filled 2016-07-11: qty 1

## 2016-07-11 MED ORDER — METHYLPREDNISOLONE SODIUM SUCC 125 MG IJ SOLR
INTRAMUSCULAR | Status: AC
  Filled 2016-07-11: qty 2

## 2016-07-11 MED ORDER — FAMOTIDINE IN NACL 20-0.9 MG/50ML-% IV SOLN
INTRAVENOUS | Status: AC
  Filled 2016-07-11: qty 50

## 2016-07-11 NOTE — ED Provider Notes (Addendum)
CSN: [id number]     Arrival date & time 07/11/16  1624 History   First MD Initiated Contact with Patient 07/11/16 1630     Chief Complaint  Patient presents with  . Allergic Reaction     (Consider location/radiation/quality/duration/timing/severity/associated sxs/prior Treatment) Patient states that he was stung by bees   Patient is a 25 y.o. male presenting with allergic reaction. The history is provided by the patient (Patient was stung on the forehead by bees. He promptly started having swelling in his face).  Allergic Reaction  Presenting symptoms: no difficulty breathing and no rash   Severity:  Moderate Prior allergic episodes:  Insect allergies Context: not animal exposure   Relieved by:  Nothing Worsened by:  Nothing tried Ineffective treatments:  Epinephrine   Past Medical History  Diagnosis Date  . 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 Rash of neck 06/26/2014  . 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 Migraines    Past Surgical History  Procedure Laterality Date  . Shoulder surgery Right     x 2   Family History  Problem Relation Age of Onset  . Stroke Father    Social History  Substance Use Topics  . Smoking status: Current Every Day Smoker -- 0.50 packs/day for 4 years    Types: Cigarettes  . Smokeless tobacco: Never Used  . Alcohol Use: Yes     Comment: occasionally    Review of Systems  Constitutional: Negative for appetite change and fatigue.  HENT: Negative for congestion, ear discharge and sinus pressure.        Facial swelling  Eyes: Negative for discharge.  Respiratory: Negative for cough.   Cardiovascular: Negative for chest pain.  Gastrointestinal: Negative for abdominal pain and diarrhea.  Genitourinary: Negative for frequency and hematuria.  Musculoskeletal: Negative for back pain.  Skin: Negative for rash.  Neurological: Negative for seizures and headaches.  Psychiatric/Behavioral: Negative for hallucinations.       Allergies  Bee venom  Home Medications   Prior to Admission medications   Medication Sig Start Date End Date Taking? Authorizing Provider  butalbital-acetaminophen-caffeine (FIORICET) [phone] MG tablet Take 1-2 tablets by mouth every 6 (six) hours as needed for headache. 04/28/16 04/28/17 Yes Ivery Quale, PA-C  diphenhydrAMINE (BENADRYL) 25 MG tablet Take 25 mg by mouth every 6 (six) hours as needed for allergies.   Yes Historical Provider, MD  FLUoxetine (PROZAC) 20 MG tablet Take 1 tablet (20 mg total) by mouth daily. 06/24/16  Yes Renee A Kuneff, DO  cyclobenzaprine (FLEXERIL) 5 MG tablet Take 1 tablet (5 mg total) by mouth 3 (three) times daily as needed for muscle spasms. Patient not taking: Reported on 07/11/2016 04/28/16   Renee A Kuneff, DO  EPINEPHrine (EPIPEN 2-PAK) 0.3 mg/0.3 mL IJ SOAJ injection Inject 0.3 mLs (0.3 mg total) into the muscle once. 07/11/16   Bethann Berkshire, MD  predniSONE (DELTASONE) 20 MG tablet 2 tabs po daily x 3 days 07/11/16   Bethann Berkshire, MD  prochlorperazine (COMPAZINE) 10 MG tablet Take 1 tablet (10 mg total) by mouth every 6 (six) hours as needed for nausea or vomiting. Patient not taking: Reported on 07/11/2016 04/28/16   Ivery Quale, PA-C  SUMAtriptan (IMITREX) 50 MG tablet Take 1 tablet (50 mg total) by mouth every 2 (two) hours as needed for migraine. May repeat in 2 hours if headache persists or recurs. Patient not taking: Reported on 07/11/2016 04/28/16   Natalia Leatherwood, DO  BP 128/72 mmHg  Pulse 85  Temp(Src) 99.1 F (37.3 C) (Oral)  Resp 17  Ht  (1.702 m)  Wt 170 lb (77.111 kg)  BMI 26.62 kg/m2  SpO2 99% Physical Exam  Constitutional: He is oriented to person, place, and time. He appears well-developed.  HENT:  Head: Normocephalic.  She has swelling around his forehead and around both eyes  Eyes: Conjunctivae and EOM are normal. No scleral icterus.  Neck: Neck supple. No thyromegaly present.  Cardiovascular: Normal rate and regular  rhythm.  Exam reveals no gallop and no friction rub.   No murmur heard. Pulmonary/Chest: No stridor. He has no wheezes. He has no rales. He exhibits no tenderness.  Abdominal: He exhibits no distension. There is no tenderness. There is no rebound.  Musculoskeletal: Normal range of motion. He exhibits no edema.  Lymphadenopathy:    He has no cervical adenopathy.  Neurological: He is oriented to person, place, and time. He exhibits normal muscle tone. Coordination normal.  Skin: No rash noted. No erythema.  Systemic changes of allergic reaction  Psychiatric: He has a normal mood and affect. His behavior is normal.    ED Course  Procedures (including critical care time) Labs Review Labs Reviewed  I-STAT CHEM 8, ED - Abnormal; Notable for the following:    Glucose, Bld 114 (*)    All other components within normal limits    Imaging Review No results found. I have personally reviewed and evaluated these images and lab results as part of my medical decision-making.   EKG Interpretation None      MDM   Final diagnoses:  Allergic reaction, initial encounter    Patient with bee sting to forehead with swelling to face. Local reaction to bee stings. Patient put on short course of prednisone and Benadryl will follow-up with his PCP    Bethann Berkshire, MD 07/11/16 1830  Bethann Berkshire, MD 07/15/16 [id number]

## 2016-07-11 NOTE — ED Notes (Signed)
Pt resting quietly with girlfriend at bedside.  Swelling improved.  No respiratory distress.

## 2016-07-11 NOTE — ED Notes (Signed)
Pt resting with eyes shut.  No distress.  

## 2016-07-11 NOTE — ED Notes (Signed)
Patient having allergic reaction to bee sting. Per patient stung 45 minutes prior to arrival to ER. Patient states stung on left eyebrow. Significant facial swelling noted. Patient reports feeling swelling in nose and throat.

## 2016-07-11 NOTE — Discharge Instructions (Signed)
Take benadryl 25 mg every 4-6 hours for swelling and itching. Follow-up with her family doctor next week if any problems

## 2016-11-20 ENCOUNTER — Ambulatory Visit: Payer: PRIVATE HEALTH INSURANCE | Admitting: Family Medicine

## 2017-05-27 ENCOUNTER — Ambulatory Visit (HOSPITAL_COMMUNITY): Payer: Self-pay | Admitting: Anesthesiology

## 2017-05-27 ENCOUNTER — Encounter (HOSPITAL_COMMUNITY)
Admission: RE | Disposition: A | Discharge status: Home / Self Care | Payer: Self-pay | Source: Ambulatory Visit | Attending: General Surgery

## 2017-05-27 ENCOUNTER — Ambulatory Visit (HOSPITAL_COMMUNITY)
Admission: RE | Admit: 2017-05-27 | Discharge: 2017-05-27 | Disposition: A | Discharge status: Home / Self Care | Payer: Self-pay | Source: Ambulatory Visit | Attending: General Surgery | Admitting: General Surgery

## 2017-05-27 ENCOUNTER — Encounter: Payer: Self-pay | Admitting: Family Medicine

## 2017-05-27 ENCOUNTER — Other Ambulatory Visit: Payer: Self-pay | Admitting: General Surgery

## 2017-05-27 ENCOUNTER — Ambulatory Visit (INDEPENDENT_AMBULATORY_CARE_PROVIDER_SITE_OTHER): Payer: Self-pay | Admitting: Family Medicine

## 2017-05-27 ENCOUNTER — Encounter (HOSPITAL_COMMUNITY): Payer: Self-pay | Admitting: *Deleted

## 2017-05-27 ENCOUNTER — Ambulatory Visit: Payer: Self-pay | Admitting: General Surgery

## 2017-05-27 VITALS — BP 130/84 | HR 85 | Temp 98.4°F | Resp 16 | Wt 165.0 lb

## 2017-05-27 DIAGNOSIS — F419 Anxiety disorder, unspecified: Secondary | ICD-10-CM | POA: Insufficient documentation

## 2017-05-27 DIAGNOSIS — L02215 Cutaneous abscess of perineum: Secondary | ICD-10-CM

## 2017-05-27 DIAGNOSIS — K610 Anal abscess: Secondary | ICD-10-CM | POA: Insufficient documentation

## 2017-05-27 DIAGNOSIS — F17210 Nicotine dependence, cigarettes, uncomplicated: Secondary | ICD-10-CM | POA: Insufficient documentation

## 2017-05-27 DIAGNOSIS — K6289 Other specified diseases of anus and rectum: Secondary | ICD-10-CM | POA: Insufficient documentation

## 2017-05-27 DIAGNOSIS — K219 Gastro-esophageal reflux disease without esophagitis: Secondary | ICD-10-CM | POA: Insufficient documentation

## 2017-05-27 DIAGNOSIS — G43909 Migraine, unspecified, not intractable, without status migrainosus: Secondary | ICD-10-CM | POA: Insufficient documentation

## 2017-05-27 HISTORY — DX: Gastro-esophageal reflux disease without esophagitis: K21.9

## 2017-05-27 HISTORY — PX: INCISION AND DRAINAGE ABSCESS: SHX5864

## 2017-05-27 HISTORY — DX: Anxiety disorder, unspecified: F41.9

## 2017-05-27 LAB — HEMOCCULT GUIAC POC 1CARD (OFFICE): Fecal Occult Blood, POC: NEGATIVE

## 2017-05-27 SURGERY — INCISION AND DRAINAGE, ABSCESS
Anesthesia: General | Site: Buttocks

## 2017-05-27 MED ORDER — LIDOCAINE 2% (20 MG/ML) 5 ML SYRINGE
INTRAMUSCULAR | Status: DC | PRN
  Administered 2017-05-27: 100 mg via INTRAVENOUS

## 2017-05-27 MED ORDER — HYDROCODONE-ACETAMINOPHEN 5-325 MG PO TABS
1.0000 | ORAL_TABLET | Freq: Once | ORAL | Status: AC
  Administered 2017-05-27: 1 via ORAL
  Filled 2017-05-27: qty 1

## 2017-05-27 MED ORDER — ACETAMINOPHEN 500 MG PO TABS
1000.0000 mg | ORAL_TABLET | ORAL | Status: AC
  Administered 2017-05-27: 1000 mg via ORAL
  Filled 2017-05-27: qty 2

## 2017-05-27 MED ORDER — LACTATED RINGERS IV SOLN
INTRAVENOUS | Status: DC
  Administered 2017-05-27: 14:00:00 via INTRAVENOUS

## 2017-05-27 MED ORDER — DEXAMETHASONE SODIUM PHOSPHATE 10 MG/ML IJ SOLN
INTRAMUSCULAR | Status: DC | PRN
  Administered 2017-05-27: 10 mg via INTRAVENOUS

## 2017-05-27 MED ORDER — HYDROCODONE-ACETAMINOPHEN 2.5-325 MG PO TABS: 1.0000 | ORAL_TABLET | ORAL | 0 refills | Status: DC | PRN

## 2017-05-27 MED ORDER — BUPIVACAINE-EPINEPHRINE 0.25% -1:200000 IJ SOLN
INTRAMUSCULAR | Status: DC | PRN
  Administered 2017-05-27: 20 mL

## 2017-05-27 MED ORDER — ONDANSETRON HCL 4 MG/2ML IJ SOLN
INTRAMUSCULAR | Status: AC
  Filled 2017-05-27: qty 2

## 2017-05-27 MED ORDER — AMOXICILLIN-POT CLAVULANATE 875-125 MG PO TABS: 1.0000 | ORAL_TABLET | Freq: Two times a day (BID) | ORAL | 0 refills | Status: DC

## 2017-05-27 MED ORDER — FENTANYL CITRATE (PF) 100 MCG/2ML IJ SOLN
INTRAMUSCULAR | Status: DC | PRN
  Administered 2017-05-27: 50 ug via INTRAVENOUS
  Administered 2017-05-27: 25 ug via INTRAVENOUS
  Administered 2017-05-27: 50 ug via INTRAVENOUS

## 2017-05-27 MED ORDER — DEXAMETHASONE SODIUM PHOSPHATE 10 MG/ML IJ SOLN
INTRAMUSCULAR | Status: AC
  Filled 2017-05-27: qty 1

## 2017-05-27 MED ORDER — LIDOCAINE 2% (20 MG/ML) 5 ML SYRINGE
INTRAMUSCULAR | Status: AC
  Filled 2017-05-27: qty 5

## 2017-05-27 MED ORDER — KETOROLAC TROMETHAMINE 30 MG/ML IJ SOLN: 30.0000 mg | Freq: Once | INTRAMUSCULAR | Status: DC | PRN

## 2017-05-27 MED ORDER — ONDANSETRON HCL 4 MG/2ML IJ SOLN
INTRAMUSCULAR | Status: DC | PRN
  Administered 2017-05-27: 4 mg via INTRAVENOUS

## 2017-05-27 MED ORDER — FENTANYL CITRATE (PF) 100 MCG/2ML IJ SOLN
INTRAMUSCULAR | Status: AC
  Filled 2017-05-27: qty 2

## 2017-05-27 MED ORDER — CHLORHEXIDINE GLUCONATE CLOTH 2 % EX PADS: 6.0000 | MEDICATED_PAD | Freq: Once | CUTANEOUS | Status: DC

## 2017-05-27 MED ORDER — MIDAZOLAM HCL 2 MG/2ML IJ SOLN
INTRAMUSCULAR | Status: DC | PRN
  Administered 2017-05-27: 2 mg via INTRAVENOUS

## 2017-05-27 MED ORDER — PROPOFOL 10 MG/ML IV BOLUS
INTRAVENOUS | Status: DC | PRN
  Administered 2017-05-27: 200 mg via INTRAVENOUS

## 2017-05-27 MED ORDER — CELECOXIB 200 MG PO CAPS
400.0000 mg | ORAL_CAPSULE | ORAL | Status: AC
  Administered 2017-05-27: 400 mg via ORAL
  Filled 2017-05-27: qty 2

## 2017-05-27 MED ORDER — SODIUM CHLORIDE 0.9 % IR SOLN
Status: DC | PRN
Start: 2017-05-27 — End: 2017-05-27
  Administered 2017-05-27: 1000 mL

## 2017-05-27 MED ORDER — MIDAZOLAM HCL 2 MG/2ML IJ SOLN
INTRAMUSCULAR | Status: AC
  Filled 2017-05-27: qty 2

## 2017-05-27 MED ORDER — BUPIVACAINE-EPINEPHRINE (PF) 0.25% -1:200000 IJ SOLN
INTRAMUSCULAR | Status: AC
  Filled 2017-05-27: qty 30

## 2017-05-27 MED ORDER — PROPOFOL 10 MG/ML IV BOLUS
INTRAVENOUS | Status: AC
  Filled 2017-05-27: qty 20

## 2017-05-27 MED ORDER — FENTANYL CITRATE (PF) 100 MCG/2ML IJ SOLN: 25.0000 ug | INTRAMUSCULAR | Status: DC | PRN

## 2017-05-27 MED ORDER — CEFOTETAN DISODIUM-DEXTROSE 2-2.08 GM-% IV SOLR
2.0000 g | INTRAVENOUS | Status: AC
  Administered 2017-05-27: 2 g via INTRAVENOUS
  Filled 2017-05-27: qty 50

## 2017-05-27 MED ORDER — SUCCINYLCHOLINE CHLORIDE 200 MG/10ML IV SOSY
PREFILLED_SYRINGE | INTRAVENOUS | Status: AC
  Filled 2017-05-27: qty 10

## 2017-05-27 MED ORDER — PROMETHAZINE HCL 25 MG/ML IJ SOLN: 6.2500 mg | INTRAMUSCULAR | Status: DC | PRN

## 2017-05-27 MED ORDER — GABAPENTIN 300 MG PO CAPS
300.0000 mg | ORAL_CAPSULE | ORAL | Status: AC
  Administered 2017-05-27: 300 mg via ORAL
  Filled 2017-05-27: qty 1

## 2017-05-27 SURGICAL SUPPLY — 22 items
BNDG GAUZE ELAST 4 BULKY (GAUZE/BANDAGES/DRESSINGS) IMPLANT
DECANTER SPIKE VIAL GLASS SM (MISCELLANEOUS) IMPLANT
DRAPE LAPAROSCOPIC ABDOMINAL (DRAPES) ×3 IMPLANT
DRSG PAD ABDOMINAL 8X10 ST (GAUZE/BANDAGES/DRESSINGS) IMPLANT
ELECT REM PT RETURN 15FT ADLT (MISCELLANEOUS) ×3 IMPLANT
GAUZE SPONGE 4X4 12PLY STRL (GAUZE/BANDAGES/DRESSINGS) ×3 IMPLANT
GLOVE BIO SURGEON STRL SZ7.5 (GLOVE) ×3 IMPLANT
GLOVE ECLIPSE 7.5 STRL STRAW (GLOVE) ×3 IMPLANT
GOWN STRL REUS W/TWL XL LVL3 (GOWN DISPOSABLE) ×6 IMPLANT
KIT BASIN OR (CUSTOM PROCEDURE TRAY) ×3 IMPLANT
NEEDLE HYPO 25X1 1.5 SAFETY (NEEDLE) ×3 IMPLANT
NS IRRIG 1000ML POUR BTL (IV SOLUTION) ×3 IMPLANT
PACK GENERAL/GYN (CUSTOM PROCEDURE TRAY) ×3 IMPLANT
SPONGE LAP 18X18 X RAY DECT (DISPOSABLE) IMPLANT
SUT MNCRL AB 4-0 PS2 18 (SUTURE) IMPLANT
SUT VIC AB 3-0 SH 27 (SUTURE)
SUT VIC AB 3-0 SH 27XBRD (SUTURE) IMPLANT
SWAB COLLECTION DEVICE MRSA (MISCELLANEOUS) ×6 IMPLANT
SWAB CULTURE ESWAB REG 1ML (MISCELLANEOUS) IMPLANT
SYR CONTROL 10ML LL (SYRINGE) IMPLANT
TOWEL OR 17X26 10 PK STRL BLUE (TOWEL DISPOSABLE) ×3 IMPLANT
TOWEL OR NON WOVEN STRL DISP B (DISPOSABLE) IMPLANT

## 2017-05-27 NOTE — Op Note (Signed)
Preoperative Diagnosis: peri anal abscess  Postoprative Diagnosis: peri anal abscess  Procedure: Procedure(s): INCISION AND DRAINAGE  PERI ANAL ABSCESS   Surgeon: Glenna FellowsHoxworth, Vivian Okelley T   Assistants: None  Anesthesia:  General LMA anesthesia  Indications: Patient is a 26 year old male who presents with 5 days of worsening pain and redness and swelling in the right anterior perianal area. Exam is consistent with a several centimeter abscess. We've recommended incision and drainage and after discussion of elected to proceed under brief general anesthetic. I discussed the nature surgery indications, expected recovery and risks of bleeding and infection and anesthetic complications and he agrees to proceed.    Procedure Detail:  Patient brought to the operating room, placed in supine position on the operating table and laryngeal mask general anesthesia induced. Carefully positioned in lithotomy position and the perineum sterilely prepped and draped. He received preoperative IV antibiotics. Patient timeout was performed and correct procedure verified. I made a curvilinear incision over the central area of fluctuance and erythema in the right anterior perianal space. A moderate amount of purulent material was drained and cultured. Loculations were broken up. The abscess cavity was thoroughly irrigated. Measured about 3 cm in diameter. Soft tissue was infiltrated with Marcaine with epinephrine. The wound was packed with moist saline gauze and clean dry dressing applied. Sponge needle and instrument counts were correct.    Findings: As above  Estimated Blood Loss:  Minimal         Drains: Wound packed with moist saline gauze  Blood Given: none          Specimens: Culture and sensitivity        Complications:  * No complications entered in OR log *         Disposition: PACU - hemodynamically stable.         Condition: stable

## 2017-05-27 NOTE — Discharge Instructions (Signed)
NEXT DOSE OF PAIN MEDICATION CAN BE TAKEN AT APPROXIMATELY 8:30 PM      General Anesthesia, Adult, Care After These instructions provide you with information about caring for yourself after your procedure. Your health care provider may also give you more specific instructions. Your treatment has been planned according to current medical practices, but problems sometimes occur. Call your health care provider if you have any problems or questions after your procedure. What can I expect after the procedure? After the procedure, it is common to have:  Vomiting.  A sore throat.  Mental slowness.  It is common to feel:  Nauseous.  Cold or shivery.  Sleepy.  Tired.  Sore or achy, even in parts of your body where you did not have surgery.  Follow these instructions at home: For at least 24 hours after the procedure:  Do not: ? Participate in activities where you could fall or become injured. ? Drive. ? Use heavy machinery. ? Drink alcohol. ? Take sleeping pills or medicines that cause drowsiness. ? Make important decisions or sign legal documents. ? Take care of children on your own.  Rest. Eating and drinking  If you vomit, drink water, juice, or soup when you can drink without vomiting.  Drink enough fluid to keep your urine clear or pale yellow.  Make sure you have little or no nausea before eating solid foods.  Follow the diet recommended by your health care provider. General instructions  Have a responsible adult stay with you until you are awake and alert.  Return to your normal activities as told by your health care provider. Ask your health care provider what activities are safe for you.  Take over-the-counter and prescription medicines only as told by your health care provider.  If you smoke, do not smoke without supervision.  Keep all follow-up visits as told by your health care provider. This is important. Contact a health care provider if:  You  continue to have nausea or vomiting at home, and medicines are not helpful.  You cannot drink fluids or start eating again.  You cannot urinate after 8-12 hours.  You develop a skin rash.  You have fever.  You have increasing redness at the site of your procedure. Get help right away if:  You have difficulty breathing.  You have chest pain.  You have unexpected bleeding.  You feel that you are having a life-threatening or urgent problem. This information is not intended to replace advice given to you by your health care provider. Make sure you discuss any questions you have with your health care provider. Document Released: 03/15/2001 Document Revised: 05/11/2016 Document Reviewed: 11/21/2015 Elsevier Interactive Patient Education  2018 [employer]   CCS _______[facility]  RECTAL SURGERY POST OP INSTRUCTIONS: POST OP INSTRUCTIONS  Always review your discharge instruction sheet given to you by the facility where your surgery was performed. IF YOU HAVE DISABILITY OR FAMILY LEAVE FORMS, YOU MUST BRING THEM TO THE OFFICE FOR PROCESSING.   DO NOT GIVE THEM TO YOUR DOCTOR.  1. A  prescription for pain medication may be given to you upon discharge.  Take your pain medication as prescribed, if needed.  If narcotic pain medicine is not needed, then you may take acetaminophen (Tylenol) or ibuprofen (Advil) as needed. 2. Take your usually prescribed medications unless otherwise directed. 3. If you need a refill on your pain medication, please contact your pharmacy.  They will contact our office to request authorization. Prescriptions will  not be filled after 5 pm or on week-ends. 4. You should follow a light diet the first 48 hours after arrival home, such as soup and crackers, etc.  Be sure to include lots of fluids daily.  Resume your normal diet 2-3 days after surgery.. 5. Most patients will experience some swelling and discomfort in the rectal area. Ice packs,  reclining and warm tub soaks will help.  Swelling and discomfort can take several days to resolve.  6. It is common to experience some constipation if taking pain medication after surgery.  Increasing fluid intake and taking a stool softener (such as Colace) will usually help or prevent this problem from occurring.  A mild laxative (Milk of Magnesia or Miralax) should be taken according to package directions if there are no bowel movements after 48 hours. 7. Unless discharge instructions indicate otherwise, leave your bandage dry and in place for 24 hours, or remove the bandage if you have a bowel movement. You may notice a small amount of bleeding with bowel movements for the first few days. You may have some packing in the rectum which will come out over the first day or two. You will need to wear an absorbent pad or soft cotton gauze in your underwear until the drainage stops.it. 8. ACTIVITIES:  You may resume regular (light) daily activities beginning the next day--such as daily self-care, walking, climbing stairs--gradually increasing activities as tolerated.  You may have sexual intercourse when it is comfortable.  Refrain from any heavy lifting or straining until approved by your doctor. a. You may drive when you are no longer taking prescription pain medication, you can comfortably wear a seatbelt, and you can safely maneuver your car and apply brakes. b. RETURN TO WORK: : ____________________ c.  9. You should see your doctor in the office for a follow-up appointment approximately 2-3 weeks after your surgery.  Make sure that you call for this appointment within a day or two after you arrive home to insure a convenient appointment time. 10. OTHER INSTRUCTIONS:  __________________________________________________________________________________________________________________________________________________________________________________________  WHEN TO CALL YOUR DOCTOR: 1. Fever over  101.0 2. Inability to urinate 3. Nausea and/or vomiting 4. Extreme swelling or bruising 5. Continued bleeding from rectum. 6. Increased pain, redness, or drainage from the incision 7. Constipation  The clinic staff is available to answer your questions during regular business hours.  Please dont hesitate to call and ask to speak to one of the nurses for clinical concerns.  If you have a medical emergency, go to the nearest emergency room or call 911.  A surgeon from [facility] is always on call at the hospital   [address], [address], [place], [place]  [address] ?  [address] [address], [place], [place]   [address] [phone] ? [phone] ? FAX [phone] Web site: [email or web address]

## 2017-05-27 NOTE — H&P (Signed)
Rodney Brown is an 26 y.o. male.    Chief Complaint: Rectal pain  HPI: Generally healthy 26 year old male presents with 5 days of gradually worsening pain and swelling in his perianal area. No history of any previous similar symptoms. No bleeding or discharge. He has noted a low-grade fever of about 99-100. Saw his primary physician today who felt there was a significant abscess and he was referred.  Past Medical History:  Diagnosis Date  . Anxiety   . 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 GERD (gastroesophageal reflux disease)   . Migraines   . Rash of neck 06/26/2014  . 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 LEG SURGERY Right 2013  . SHOULDER SURGERY Right    x 2  . WISDOM TOOTH EXTRACTION      Family History  Problem Relation Age of Onset  . Stroke Father    Social History:  reports that he has been smoking Cigarettes.  He has a 2.00 pack-year smoking history. He has never used smokeless tobacco. He reports that he drinks alcohol. He reports that he does not use drugs.  Allergies:  Allergies  Allergen Reactions  . Bee Venom Swelling    Medications Prior to Admission  Medication Sig Dispense Refill  . EPINEPHrine (EPIPEN 2-PAK) 0.3 mg/0.3 mL IJ SOAJ injection Inject 0.3 mLs (0.3 mg total) into the muscle once. 2 Device 0  . shark liver oil-cocoa butter (PREPARATION H) 0.25-3-85.5 % suppository Place 1 suppository rectally as needed for hemorrhoids.    Marland Kitchen. amoxicillin-clavulanate (AUGMENTIN) 875-125 MG tablet Take 1 tablet by mouth 2 (two) times daily. (Patient not taking: Reported on 05/27/2017) 20 tablet 0    Results for orders placed or performed in visit on 05/27/17 (from the past 48 hour(s))  POCT Occult Blood Stool     Status: Normal   Collection Time: 05/27/17 11:01 AM  Result Value Ref Range   Fecal Occult Blood, POC Negative Negative   Card #1 Date     Card #2 Fecal Occult Blod, POC      Card #2 Date     Card #3 Fecal Occult Blood, POC     Card #3 Date     No results found.  Review of Systems  Constitutional: Positive for fever. Negative for chills.  Respiratory: Negative.   Cardiovascular: Negative.   Musculoskeletal: Positive for joint pain.    Blood pressure (!) 155/93, pulse 92, temperature 99.6 F (37.6 C), temperature source Oral, resp. rate 18, height 5\' 7"  (1.702 m), weight 74.6 kg (164 lb 6 oz), SpO2 99 %. Physical Exam  Gen.: Fit appearing young Caucasian male no distress Skin: No rash or infection HEENT: Sclera nonicteric. No masses Lungs: Clear easy respirations without wheezing Cardiac: Regular rate and rhythm. No edema. Abdomen: Soft and nontender Rectal: In the right posterior perianal area extending down toward the right buttock is a several centimeter area of induration, erythema and central fluctuance consistent with abscess Extremities: No joint swelling or deformity Neurologic: He is alert and fully oriented. Affect normal. Gait normal  Assessment/Plan Perirectal or large perianal abscess. Will require incision and drainage. Discussed options with patient and he feels unlikely we'll be able to tolerate anything less than general anesthesia and this is not unreasonable. Plan incision and drainage under brief general anesthesia as an outpatient.  Mariella SaaHOXWORTH,Jailyn Langhorst T, MD 05/27/2017, 1:47 PM

## 2017-05-27 NOTE — Anesthesia Postprocedure Evaluation (Signed)
Anesthesia Post Note  Patient: Rodney Brown  Procedure(s) Performed: Procedure(s) (LRB): INCISION AND DRAINAGE  PERINEAL ABSCESS (N/A)     Patient location during evaluation: PACU Anesthesia Type: General Level of consciousness: awake and alert Pain management: pain level controlled Vital Signs Assessment: post-procedure vital signs reviewed and stable Respiratory status: spontaneous breathing, nonlabored ventilation, respiratory function stable and patient connected to nasal cannula oxygen Cardiovascular status: blood pressure returned to baseline and stable Postop Assessment: no signs of nausea or vomiting Anesthetic complications: no    Last Vitals:  Vitals:   05/27/17 1507 05/27/17 1515  BP: 127/78 134/77  Pulse: 88 89  Resp: 13 15  Temp: 37 C     Last Pain:  Vitals:   05/27/17 1515  TempSrc:   PainSc: 0-No pain                 Madalaine Portier S

## 2017-05-27 NOTE — Patient Instructions (Addendum)
Perianal Abscess  An abscess is an infected area that is filled with pus. A perianal abscess occurs in the perineum, which is the area between the anus and the scrotum in males and between the anus and the vagina in females. Perianal abscesses can vary in size. Without treatment, a perianal abscess can become larger and cause other problems.  What are the causes?  This condition is caused by:  · Waste from damaged or dead tissue (debris) that plugs up glands in the perineum. When this happens, an abscess may form.  · Infections of the perineum.    What are the signs or symptoms?  Common symptoms of this condition include:  · Swelling and redness in the area of the abscess. The redness may go beyond the abscess and appear as a red streak on the skin.  · Pain in the area of the abscess, including pain when sitting, walking, or passing stool.    Other possible symptoms include:  · A visible, painful lump, or a lump that can be felt when touched.  · Bleeding or pus-like discharge from the area.  · Fever.  · General weakness.    How is this diagnosed?  This condition is diagnosed based on your medical history and a physical exam of the affected area.  · This may involve examining the rectal area with a gloved hand (digital rectal exam).  · Sometimes, the health care provider needs to look into the rectum using a probe or a scope.  · For women, it may require a careful vaginal exam.    How is this treated?  Treatment for this condition may include:  · Making a cut (incision) in the abscess to drain the pus. This can sometimes be done in your health care provider's office or an emergency department after you are given medicine to numb the area (local anesthetic).  · Surgery to drain the abscess. This is for larger or deeper abscesses.  · Antibiotic medicines, if there is infection in the surrounding tissue (cellulitis).  · Having gauze packed into the abscess to continue draining the area.  · Frequent baths in warm water  that is deep enough to cover your hips and buttocks (sitz baths). These help the wound heal and they make the abscess less likely to come back.    Follow these instructions at home:  Medicines  · Take over-the-counter and prescription medicines for pain, fever, or discomfort only as told by your health care provider.  · If you were prescribed an antibiotic medicine, use it as told by your health care provider. Do not stop using the antibiotic even if you start to feel better.  · Do not drive or use heavy machinery while taking prescription pain medicine.  Wound care    · Keep the skin around the wound clean and dry. Avoid cleaning the area too much.  · Avoid scratching the wound.  · Avoid using colored or perfumed toilet papers.  · Take a sitz bath 3-4 times a day and after bowel movements. This will help reduce pain and swelling.  · If directed, apply ice to the injured area:  ? Put ice in a plastic bag.  ? Place a towel between your skin and the bag.  ? Leave the ice on for 20 minutes, 2-3 times a day.  · Check your incision area every day for signs of infection. Check for:  ? More redness, swelling, or pain.  ? More fluid or blood.  ?   Warmth.  ? Pus or a bad smell.  Gauze  · If gauze was used in the abscess, follow instructions from your health care provider about removing or changing the gauze. It can usually be removed in 2-3 days.  · Wash your hands with soap and water before you remove or change your gauze. If soap and water are not available, use hand sanitizer.  · If one or more drains were placed in the abscess cavity, be careful not to pull at them. Your health care provider will tell you how long they need to remain in place.  General instructions  · Keep all follow-up visits as told by your health care provider. This is important.  Contact a health care provider if:  · You have trouble passing stool or passing urine.  · Your pain or swelling in the affected area does not seem to be getting  better.  · The gauze packing or the drains come out before the planned time.  Get help right away if:  · You have problems moving or using your legs.  · You have severe or increasing pain.  · Your swelling in the affected area suddenly gets worse.  · You have a large increase in bleeding or passing of pus.  · You have chills or a fever.  This information is not intended to replace advice given to you by your health care provider. Make sure you discuss any questions you have with your health care provider.  Document Released: 01/13/2007 Document Revised: 06/26/2016 Document Reviewed: 05/18/2016  Elsevier Interactive Patient Education © 2018 [employer]

## 2017-05-27 NOTE — Anesthesia Preprocedure Evaluation (Signed)
Anesthesia Evaluation  Patient identified by MRN, date of birth, ID band Patient awake    Reviewed: Allergy & Precautions, NPO status , Patient's Chart, lab work & pertinent test results  Airway Mallampati: II  TM Distance: >3 FB Neck ROM: Full    Dental no notable dental hx.    Pulmonary Current Smoker,    Pulmonary exam normal breath sounds clear to auscultation       Cardiovascular negative cardio ROS Normal cardiovascular exam Rhythm:Regular Rate:Normal     Neuro/Psych negative neurological ROS  negative psych ROS   GI/Hepatic negative GI ROS, Neg liver ROS,   Endo/Other  negative endocrine ROS  Renal/GU negative Renal ROS  negative genitourinary   Musculoskeletal negative musculoskeletal ROS (+)   Abdominal   Peds negative pediatric ROS (+)  Hematology negative hematology ROS (+)   Anesthesia Other Findings   Reproductive/Obstetrics negative OB ROS                             Anesthesia Physical Anesthesia Plan  ASA: II  Anesthesia Plan: General   Post-op Pain Management:    Induction: Intravenous  PONV Risk Score and Plan: 2 and Ondansetron, Dexamethasone and Treatment may vary due to age  Airway Management Planned: LMA and Oral ETT  Additional Equipment:   Intra-op Plan:   Post-operative Plan: Extubation in OR  Informed Consent: I have reviewed the patients History and Physical, chart, labs and discussed the procedure including the risks, benefits and alternatives for the proposed anesthesia with the patient or authorized representative who has indicated his/her understanding and acceptance.   Dental advisory given  Plan Discussed with: CRNA and Surgeon  Anesthesia Plan Comments:         Anesthesia Quick Evaluation

## 2017-05-27 NOTE — Transfer of Care (Signed)
Immediate Anesthesia Transfer of Care Note  Patient: Rodney Brown  Procedure(s) Performed: Procedure(s): INCISION AND DRAINAGE  PERINEAL ABSCESS (N/A)  Patient Location: PACU  Anesthesia Type:General  Level of Consciousness: awake, alert , oriented and patient cooperative  Airway & Oxygen Therapy: Patient Spontanous Breathing and Patient connected to face mask oxygen  Post-op Assessment: Report given to RN and Post -op Vital signs reviewed and stable  Post vital signs: Reviewed and stable  Last Vitals:  Vitals:   05/27/17 1244  BP: (!) 155/93  Pulse: 92  Resp: 18  Temp: 37.6 C    Last Pain:  Vitals:   05/27/17 1314  TempSrc:   PainSc: 8          Complications: No apparent anesthesia complications

## 2017-05-27 NOTE — Progress Notes (Signed)
Rodney Brown , 1991/11/25, 26 y.o., male MRN: [id number] Patient Care Team    Relationship Specialty Notifications Start End  Natalia LeatherwoodKuneff, Renee A, DO PCP - General Family Medicine  03/16/16     Chief Complaint  Patient presents with  . Hemorrhoids    x 5 days     Subjective: Rodney Brown is a 26 y.o. current everyday smoker, but otherwise healthy male presents for an acute OV with complaints of rectal pain  of 5 days duration.  Associated symptoms include redness, swelling, burning, chills and nausea. He has not been able to sleep secondary to pain. He has not been able to work this week. He endorses lifting a rather heavy metal beam on Friday the day prior to the onset.  He noticed the area after a BM on Saturday morning.  Pt has tried OTC hemorrhoid cream and epson salt soaks to ease their symptoms.   Depression screen PHQ 2/9 06/01/2016  Decreased Interest 1  Down, Depressed, Hopeless 1  PHQ - 2 Score 2  Altered sleeping 1  Tired, decreased energy 2  Change in appetite 0  Feeling bad or failure about yourself  0  Trouble concentrating 1  Moving slowly or fidgety/restless 1  Suicidal thoughts 0  PHQ-9 Score 7  Difficult doing work/chores Somewhat difficult    Allergies  Allergen Reactions  . Bee Venom Swelling   Social History  Substance Use Topics  . Smoking status: Current Every Day Smoker    Packs/day: 0.50    Years: 4.00    Types: Cigarettes  . Smokeless tobacco: Never Used  . Alcohol use Yes     Comment: occasionally   Past Medical History:  Diagnosis Date  . 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 Migraines   . Rash of neck 06/26/2014  . 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 SHOULDER SURGERY Right    x 2   Family History  Problem Relation Age of Onset  . Stroke Father    Allergies as of 05/27/2017      Reactions   Bee Venom Swelling      Medication List       Accurate  as of 05/27/17 11:01 AM. Always use your most recent med list.          amoxicillin-clavulanate 875-125 MG tablet Commonly known as:  AUGMENTIN Take 1 tablet by mouth 2 (two) times daily.   EPINEPHrine 0.3 mg/0.3 mL Soaj injection Commonly known as:  EPIPEN 2-PAK Inject 0.3 mLs (0.3 mg total) into the muscle once.       All past medical history, surgical history, allergies, family history, immunizations andmedications were updated in the EMR today and reviewed under the history and medication portions of their EMR.     ROS: Negative, with the exception of above mentioned in HPI   Objective:  BP 130/84 (BP Location: Left Arm, Patient Position: Sitting, Cuff Size: Normal)   Pulse 85   Temp 98.4 F (36.9 C) (Oral)   Resp 16   Wt 165 lb (74.8 kg)   SpO2 99%   BMI 25.84 kg/m  Body mass index is 25.84 kg/m. Gen: Afebrile. No acute distress. Nontoxic in appearance, well developed, well nourished.  HENT: AT. [place]. MMM Eyes:Pupils Equal Round Reactive to light, Extraocular movements intact,  Conjunctiva without redness, discharge or icterus. CV: RRR Neuro: uncomfortable gait. PERLA. EOMi. Alert. Oriented x3  GU:  erythema, swelling, severe tenderness ano-rectal and perineum R>L. Difficult exam secondary to discomfort. No external or internal hemorrhoids noted.    No exam data present No results found. Results for orders placed or performed in visit on 05/27/17 (from the past 24 hour(s))  POCT Occult Blood Stool     Status: Normal   Collection Time: 05/27/17 11:01 AM  Result Value Ref Range   Fecal Occult Blood, POC Negative Negative   Card #1 Date     Card #2 Fecal Occult Blod, POC     Card #2 Date     Card #3 Fecal Occult Blood, POC     Card #3 Date      Assessment/Plan: Rodney Brown is a 26 y.o. male present for OV for  Anorectal/perineal abscess - Afebrile, but rather significant tenderness and swelling, perineal abscess present. Pt needs to be seen by Surgery or  ED. - POCT Gastric Occult Blood--> negative - Prescribed Augmentin, but asked him to hold on picking it up unless specialist wants him to take since he will be seen today. amoxicillin-clavulanate (AUGMENTIN) 875-125 MG tablet; Take 1 tablet by mouth 2 (two) times daily.  Dispense: 20 tablet; Refill: 0 - Ambulatory referral to General Surgery--> Emergent referral place. Office was contacted, while pt in the office, to attempt to schedule. Unfortunately, their office is experiencing problems with all their computers and telephone systems not functioning. Dr. Johna Sheriff called  back and we spoke directly. He took the patients phone number and demographics and will call patient within an hour to have either come to their office or meet at the hospital today for an appt. to I&D. Pt is agreeable with plain. If unable to be seen today or worsens before call back, pt is to go to  ED. Pt understands instructions.   Reviewed expectations re: course of current medical issues.  Discussed self-management of symptoms.  Outlined signs and symptoms indicating need for more acute intervention.  Patient verbalized understanding and all questions were answered.  Patient received an After-Visit Summary.  Note is dictated utilizing voice recognition software. Although note has been proof read prior to signing, occasional typographical errors still can be missed. If any questions arise, please do not hesitate to call for verification.   electronically signed by:  Felix Pacini, DO  [facility] - [place]

## 2017-05-27 NOTE — Anesthesia Procedure Notes (Signed)
Procedure Name: LMA Insertion Date/Time: 05/27/2017 2:25 PM Performed by: Delphia GratesHANDLER, Melaya Hoselton Pre-anesthesia Checklist: Emergency Drugs available, Suction available, Patient being monitored and Patient identified Patient Re-evaluated:Patient Re-evaluated prior to inductionOxygen Delivery Method: Circle system utilized Preoxygenation: Pre-oxygenation with 100% oxygen Intubation Type: IV induction LMA: LMA with gastric port inserted LMA Size: 4.0 Number of attempts: 1 Placement Confirmation: positive ETCO2 and breath sounds checked- equal and bilateral Tube secured with: Tape Dental Injury: Teeth and Oropharynx as per pre-operative assessment

## 2017-05-28 ENCOUNTER — Encounter (HOSPITAL_COMMUNITY): Payer: Self-pay | Admitting: General Surgery

## 2017-05-30 LAB — AEROBIC CULTURE  (SUPERFICIAL SPECIMEN): CULTURE: NO GROWTH

## 2017-06-01 LAB — ANAEROBIC CULTURE

## 2017-06-08 ENCOUNTER — Encounter: Payer: Self-pay | Admitting: Family Medicine

## 2017-06-08 ENCOUNTER — Ambulatory Visit (INDEPENDENT_AMBULATORY_CARE_PROVIDER_SITE_OTHER): Payer: Self-pay | Admitting: Family Medicine

## 2017-06-08 VITALS — BP 129/85 | HR 67 | Temp 98.1°F | Resp 20 | Wt 162.0 lb

## 2017-06-08 DIAGNOSIS — F418 Other specified anxiety disorders: Secondary | ICD-10-CM

## 2017-06-08 MED ORDER — DULOXETINE HCL 30 MG PO CPEP: 30.0000 mg | ORAL_CAPSULE | Freq: Every day | ORAL | 0 refills | Status: DC

## 2017-06-08 MED ORDER — CLONAZEPAM 0.5 MG PO TABS: 0.2500 mg | ORAL_TABLET | Freq: Two times a day (BID) | ORAL | 2 refills | Status: DC | PRN

## 2017-06-08 NOTE — Progress Notes (Signed)
Patient ID: Rodney Brown, male   DOB: [birth year], 26 y.o.   MRN: [id number]    Rodney Brown , [birth year], 26 y.o., male MRN: [id number]  Chief Complaint  Patient presents with  . Anxiety  . Depression    Subjective:  Depression/anxiety:  Pt presents for new complaints of worsening anxiety. He has had anxiety in the past and was tried on effexor which made him tired and prozac 20 mg which he did not feel it worked well, but did not take it that long or taper on dose. He report over the last few months he has experienced increase anxiety to the point of not being able "to handle it" anymore. He endorses inability to sleep secondary to thinking of "worse case" scenarios, heart racing and worry. He admits to have episodes of doom, body becomes hot, feelings of fear and doom. He reports he knows he is being hard on himself. He wish he could snap out of it but can not seem to find away around it. He reports he knows he has issues with GAD because he stresses about many small things that he knows are trivial. He his currently in a more stressful life situation with his relationship, recent illness requiring minor surgery, truck breaking down, and starting a new job. He reports he did just end a relationship that he is finding very hard to let go of. He has family and friends to support him, but he feels they would think "he just needs to get over it and stop being a wimp." He denies SI or HI. He is currently not insured, but expects his benefits to kick in a few weeks.  Prior note 2017: Pt reports he has noticed he has less desire to do activities he had enjoyed and does not feel he is as "happy" as he used to be. He states his girlfriend has noticed the changes  as well. He reports he has no energy or desire to be social or motivated to perform activities he use to enjoy (washing four-wheeler etc). He does not think he is depressed, but uncertain. He endorses increased fatigue. He states he  tosses and  turn thoughout the night, falls asleep quickly, had good sleep hygiene by report. He states he relies on energy drinks to get him going in the morning. He states his Girlfriend does complain of him snoring, and sometimes "holding his breath". He has been having afternoon headaches and finds himself needing caffeine. Mood disorder screen: negative 05/2016 [facility] 06/08/2017 06/01/2016  Decreased Interest 1 1  Down, Depressed, Hopeless 2 1  PHQ - 2 Score 3 2  Altered sleeping 3 1  Tired, decreased energy 2 2  Change in appetite 2 0  Feeling bad or failure about yourself  2 0  Trouble concentrating 1 1  Moving slowly or fidgety/restless 0 1  Suicidal thoughts 0 0  PHQ-9 Score 13 7  Difficult doing work/chores - Somewhat difficult    GAD 7 : Generalized Anxiety Score 06/08/2017 06/01/2016  Nervous, Anxious, on Edge 3 2  Control/stop worrying 2 2  Worry too much - different things 2 2  Trouble relaxing 1 2  Restless 1 1  Easily annoyed or irritable 2 2  Afraid - awful might happen 3 2  Total GAD 7 Score 14 13  Anxiety Difficulty Very difficult Somewhat difficult     Allergies  Allergen Reactions  . Bee Venom Swelling   Social History  Substance Use  Topics  . Smoking status: Current Every Day Smoker    Packs/day: 0.50    Years: 4.00    Types: Cigarettes  . Smokeless tobacco: Never Used  . Alcohol use Yes     Comment: occasionally   Past Medical History:  Diagnosis Date  . Anxiety   . 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 GERD (gastroesophageal reflux disease)   . Migraines   . Rash of neck 06/26/2014  . 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 INCISION AND DRAINAGE ABSCESS N/A 05/27/2017   Procedure: INCISION AND DRAINAGE  PERINEAL ABSCESS;  Surgeon: Glenna Fellows, MD;  Location: [facility];  Service: General;  Laterality: N/A;  . LEG SURGERY Right 2013  . SHOULDER SURGERY Right      x 2  . WISDOM TOOTH EXTRACTION     Family History  Problem Relation Age of Onset  . Stroke Father    Allergies as of 06/08/2017      Reactions   Bee Venom Swelling      Medication List       Accurate as of 06/08/17  8:16 AM. Always use your most recent med list.          EPINEPHrine 0.3 mg/0.3 mL Soaj injection Commonly known as:  EPIPEN 2-PAK Inject 0.3 mLs (0.3 mg total) into the muscle once.       ROS: Negative, with the exception of above mentioned in HPI  Objective:  BP 129/85 (BP Location: Right Arm, Patient Position: Sitting, Cuff Size: Normal)   Pulse 67   Temp 98.1 F (36.7 C)   Resp 20   Wt 162 lb (73.5 kg)   SpO2 97%   BMI 25.37 kg/m  Body mass index is 25.37 kg/m. Gen: Afebrile. No acute distress. Anxious.  HENT: AT. [place]. MMM.   Neuro: Normal gait. PERLA. EOMi. Alert. Oriented.  Psych: Moderately anxious, otherwise Normal affect, dress and demeanor. Normal speech. Normal thought content and judgment..    Assessment/Plan: Rodney Brown is a 26 y.o. 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 We discussed multiple different medication management options, and agreed to Cymbalta 30 mg QD for 4 week, if needed could increase at follow up. --> given no Best boy for a coupon was printed from [facility] for [employer].  - discussed klonopin use for insomnia secondary to anxiety and use if panic attack is occurring or building. He is to start klonopin  0.5 mg dose tonight and if panic attack try 1/2 tab first (caution on sedation- but if needed can use 1 tab BID PRN). NCCS database reviewed and appropriate. Controlled substance agreement signed today for (benzo only). -  once insurance benefits start, will offer referral for CBT.  - F/U 4 weeks  > 25 minutes spent with patient, >50% of time spent face to face counseling patient and coordinating care.   electronically signed by:  Felix Pacini, DO  [facility] - [place]

## 2017-06-08 NOTE — Patient Instructions (Addendum)
Start Cymbalta daily. Be patient, it can take a few weeks to get level in your system and you get full benefit. Follow up in 4 weeks, if we need to we can increase the dose at that time.   Klonopin 1 tab at night, about an hour before bed. You can take 1/2 tab in the day if you are experiencing increased panic at that time.    Generalized Anxiety Disorder, Adult Generalized anxiety disorder (GAD) is a mental health disorder. People with this condition constantly worry about everyday events. Unlike normal anxiety, worry related to GAD is not triggered by a specific event. These worries also do not fade or get better with time. GAD interferes with life functions, including relationships, work, and school. GAD can vary from mild to severe. People with severe GAD can have intense waves of anxiety with physical symptoms (panic attacks). What are the causes? The exact cause of GAD is not known. What increases the risk? This condition is more likely to develop in:  Women.  People who have a family history of anxiety disorders.  People who are very shy.  People who experience very stressful life events, such as the death of a loved one.  People who have a very stressful family environment.  What are the signs or symptoms? People with GAD often worry excessively about many things in their lives, such as their health and family. They may also be overly concerned about:  Doing well at work.  Being on time.  Natural disasters.  Friendships.  Physical symptoms of GAD include:  Fatigue.  Muscle tension or having muscle twitches.  Trembling or feeling shaky.  Being easily startled.  Feeling like your heart is pounding or racing.  Feeling out of breath or like you cannot take a deep breath.  Having trouble falling asleep or staying asleep.  Sweating.  Nausea, diarrhea, or irritable bowel syndrome (IBS).  Headaches.  Trouble concentrating or remembering  facts.  Restlessness.  Irritability.  How is this diagnosed? Your health care provider can diagnose GAD based on your symptoms and medical history. You will also have a physical exam. The health care provider will ask specific questions about your symptoms, including how severe they are, when they started, and if they come and go. Your health care provider may ask you about your use of alcohol or drugs, including prescription medicines. Your health care provider may refer you to a mental health specialist for further evaluation. Your health care provider will do a thorough examination and may perform additional tests to rule out other possible causes of your symptoms. To be diagnosed with GAD, a person must have anxiety that:  Is out of his or her control.  Affects several different aspects of his or her life, such as work and relationships.  Causes distress that makes him or her unable to take part in normal activities.  Includes at least three physical symptoms of GAD, such as restlessness, fatigue, trouble concentrating, irritability, muscle tension, or sleep problems.  Before your health care provider can confirm a diagnosis of GAD, these symptoms must be present more days than they are not, and they must last for six months or longer. How is this treated? The following therapies are usually used to treat GAD:  Medicine. Antidepressant medicine is usually prescribed for long-term daily control. Antianxiety medicines may be added in severe cases, especially when panic attacks occur.  Talk therapy (psychotherapy). Certain types of talk therapy can be helpful in treating GAD  by providing support, education, and guidance. Options include: ? Cognitive behavioral therapy (CBT). People learn coping skills and techniques to ease their anxiety. They learn to identify unrealistic or negative thoughts and behaviors and to replace them with positive ones. ? Acceptance and commitment therapy (ACT).  This treatment teaches people how to be mindful as a way to cope with unwanted thoughts and feelings. ? Biofeedback. This process trains you to manage your body's response (physiological response) through breathing techniques and relaxation methods. You will work with a therapist while machines are used to monitor your physical symptoms.  Stress management techniques. These include yoga, meditation, and exercise.  A mental health specialist can help determine which treatment is best for you. Some people see improvement with one type of therapy. However, other people require a combination of therapies. Follow these instructions at home:  Take over-the-counter and prescription medicines only as told by your health care provider.  Try to maintain a normal routine.  Try to anticipate stressful situations and allow extra time to manage them.  Practice any stress management or self-calming techniques as taught by your health care provider.  Do not punish yourself for setbacks or for not making progress.  Try to recognize your accomplishments, even if they are small.  Keep all follow-up visits as told by your health care provider. This is important. Contact a health care provider if:  Your symptoms do not get better.  Your symptoms get worse.  You have signs of depression, such as: ? A persistently sad, cranky, or irritable mood. ? Loss of enjoyment in activities that used to bring you joy. ? Change in weight or eating. ? Changes in sleeping habits. ? Avoiding friends or family members. ? Loss of energy for normal tasks. ? Feelings of guilt or worthlessness. Get help right away if:  You have serious thoughts about hurting yourself or others. If you ever feel like you may hurt yourself or others, or have thoughts about taking your own life, get help right away. You can go to your nearest emergency department or call:  Your local emergency services (911 in the [place]  A suicide  crisis helpline, such as the National Suicide Prevention Lifeline at (317) 209-1926. This is open 24 hours a day.  Summary  Generalized anxiety disorder (GAD) is a mental health disorder that involves worry that is not triggered by a specific event.  People with GAD often worry excessively about many things in their lives, such as their health and family.  GAD may cause physical symptoms such as restlessness, trouble concentrating, sleep problems, frequent sweating, nausea, diarrhea, headaches, and trembling or muscle twitching.  A mental health specialist can help determine which treatment is best for you. Some people see improvement with one type of therapy. However, other people require a combination of therapies. This information is not intended to replace advice given to you by your health care provider. Make sure you discuss any questions you have with your health care provider. Document Released: 04/03/2013 Document Revised: 10/27/2016 Document Reviewed: 10/27/2016 Elsevier Interactive Patient Education  2018 [employer].   Depressive Disorder, Adult Depressive disorder (MDD) is a mental health condition. MDD often makes you feel sad, hopeless, or helpless. MDD can also cause symptoms in your body. MDD can affect your:  Work.  School.  Relationships.  Other normal activities.  MDD can range from mild to very bad. It may occur once (single episode MDD). It can also occur many times (recurrent MDD). The main symptoms  of MDD often include:  Feeling sad, depressed, or irritable most of the time.  Loss of interest.  MDD symptoms also include:  Sleeping too much or too little.  Eating too much or too little.  A change in your weight.  Feeling tired (fatigue) or having low energy.  Feeling worthless.  Feeling guilty.  Trouble making decisions.  Trouble thinking clearly.  Thoughts of suicide or harming others.  Feeling weak.  Feeling agitated.  Keeping  yourself from being around other people (isolation).  Follow these instructions at home: Activity  Do these things as told by your doctor: ? Go back to your normal activities. ? Exercise regularly. ? Spend time outdoors. Alcohol  Talk with your doctor about how alcohol can affect your antidepressant medicines.  Do not drink alcohol. Or, limit how much alcohol you drink. ? This means no more than 1 drink a day for nonpregnant women and 2 drinks a day for men. One drink equals one of these:  12 oz of beer.  5 oz of wine.  1 oz of hard liquor. General instructions  Take over-the-counter and prescription medicines only as told by your doctor.  Eat a healthy diet.  Get plenty of sleep.  Find activities that you enjoy. Make time to do them.  Think about joining a support group. Your doctor may be able to suggest a group for you.  Keep all follow-up visits as told by your doctor. This is important. Where to find more information:  [employer] on Mental Illness: ? www.nami.org  [place] [employer] of Mental Health: ? http://www.maynard.net/  National Suicide Prevention Lifeline: ? (416) 567-0156. This is free, 24-hour help. Contact a doctor if:  Your symptoms get worse.  You have new symptoms. Get help right away if:  You self-harm.  You see, hear, taste, smell, or feel things that are not present (hallucinate). If you ever feel like you may hurt yourself or others, or have thoughts about taking your own life, get help right away. You can go to your nearest emergency department or call:  Your local emergency services (911 in the [place]  A suicide crisis helpline, such as the National Suicide Prevention Lifeline: ? 337-167-6301. This is open 24 hours a day.  This information is not intended to replace advice given to you by your health care provider. Make sure you discuss any questions you have with your health care provider. Document Released: 11/18/2015  Document Revised: 08/23/2016 Document Reviewed: 08/23/2016 Elsevier Interactive Patient Education  2017 [employer].

## 2017-07-06 ENCOUNTER — Encounter: Payer: Self-pay | Admitting: Family Medicine

## 2017-07-06 ENCOUNTER — Ambulatory Visit: Payer: Self-pay | Admitting: Family Medicine

## 2020-05-24 ENCOUNTER — Ambulatory Visit (HOSPITAL_BASED_OUTPATIENT_CLINIC_OR_DEPARTMENT_OTHER)
Admission: RE | Admit: 2020-05-24 | Discharge: 2020-05-24 | Disposition: A | Discharge status: Home / Self Care | Payer: BC Managed Care – PPO | Source: Ambulatory Visit | Attending: Family Medicine | Admitting: Family Medicine

## 2020-05-24 ENCOUNTER — Ambulatory Visit (INDEPENDENT_AMBULATORY_CARE_PROVIDER_SITE_OTHER): Payer: BC Managed Care – PPO | Admitting: Family Medicine

## 2020-05-24 ENCOUNTER — Other Ambulatory Visit: Payer: Self-pay

## 2020-05-24 ENCOUNTER — Encounter: Payer: Self-pay | Admitting: Family Medicine

## 2020-05-24 VITALS — BP 138/80 | HR 93 | Temp 97.8°F | Ht 67.0 in | Wt 181.0 lb

## 2020-05-24 DIAGNOSIS — M5416 Radiculopathy, lumbar region: Secondary | ICD-10-CM

## 2020-05-24 MED ORDER — METHYLPREDNISOLONE ACETATE 80 MG/ML IJ SUSP
80.0000 mg | Freq: Once | INTRAMUSCULAR | Status: AC
Start: 2020-05-24 — End: 2020-05-24
  Administered 2020-05-24: 80 mg via INTRAMUSCULAR

## 2020-05-24 MED ORDER — TRAMADOL HCL 50 MG PO TABS: 50.0000 mg | ORAL_TABLET | Freq: Three times a day (TID) | ORAL | 0 refills | Status: AC | PRN

## 2020-05-24 MED ORDER — PREDNISONE 20 MG PO TABS: ORAL_TABLET | ORAL | 0 refills | Status: DC

## 2020-05-24 MED ORDER — METHOCARBAMOL 500 MG PO TABS: 500.0000 mg | ORAL_TABLET | Freq: Three times a day (TID) | ORAL | 0 refills | Status: DC

## 2020-05-24 NOTE — Patient Instructions (Addendum)
Steroid shot today.  Start steroid pill taper tomorrow.  Robaxin muscle relaxer at least every night for 1 week, but can use Every 8 hours if needed- caution on sedation.  Tramadol for pain every 8 hours if needed.  Follow up in 2 weeks.   Lumbar Strain A lumbar strain, which is sometimes called a low-back strain, is a stretch or tear in a muscle or the strong cords of tissue that attach muscle to bone (tendons) in the lower back (lumbar spine). This type of injury occurs when muscles or tendons are torn or are stretched beyond their limits. Lumbar strains can range from mild to severe. Mild strains may involve stretching a muscle or tendon without tearing it. These may heal in 1-2 weeks. More severe strains involve tearing of muscle fibers or tendons. These will cause more pain and may take 6-8 weeks to heal. What are the causes? This condition may be caused by:  Trauma, such as a fall or a hit to the body.  Twisting or overstretching the back. This may result from doing activities that need a lot of energy, such as lifting heavy objects. What increases the risk? This injury is more common in:  Athletes.  People with obesity.  People who do repeated lifting, bending, or other movements that involve their back. What are the signs or symptoms? Symptoms of this condition may include:  Sharp or dull pain in the lower back that does not go away. The pain may extend to the buttocks.  Stiffness or limited range of motion.  Sudden muscle tightening (spasms). How is this diagnosed? This condition may be diagnosed based on:  Your symptoms.  Your medical history.  A physical exam.  Imaging tests, such as: ? X-rays. ? MRI. How is this treated? Treatment for this condition may include:  Rest.  Applying heat and cold to the affected area.  Over-the-counter medicines to help relieve pain and inflammation, such as NSAIDs.  Prescription pain medicine and muscle relaxants may be  needed for a short time.  Physical therapy. Follow these instructions at home: Managing pain, stiffness, and swelling      If directed, put ice on the injured area during the first 24 hours after your injury. ? Put ice in a plastic bag. ? Place a towel between your skin and the bag. ? Leave the ice on for 20 minutes, 2-3 times a day.  If directed, apply heat to the affected area as often as told by your health care provider. Use the heat source that your health care provider recommends, such as a moist heat pack or a heating pad. ? Place a towel between your skin and the heat source. ? Leave the heat on for 20-30 minutes. ? Remove the heat if your skin turns bright red. This is especially important if you are unable to feel pain, heat, or cold. You may have a greater risk of getting burned. Activity  Rest and return to your normal activities as told by your health care provider. Ask your health care provider what activities are safe for you.  Do exercises as told by your health care provider. Medicines  Take over-the-counter and prescription medicines only as told by your health care provider.  Ask your health care provider if the medicine prescribed to you: ? Requires you to avoid driving or using heavy machinery. ? Can cause constipation. You may need to take these actions to prevent or treat constipation:  Drink enough fluid to keep your  urine pale yellow.  Take over-the-counter or prescription medicines.  Eat foods that are high in fiber, such as beans, whole grains, and fresh fruits and vegetables.  Limit foods that are high in fat and processed sugars, such as fried or sweet foods. Injury prevention To prevent a future low-back injury:  Always warm up properly before physical activity or sports.  Cool down and stretch after being active.  Use correct form when playing sports and lifting heavy objects. Bend your knees before you lift heavy objects.  Use good  posture when sitting and standing.  Stay physically fit and keep a healthy weight. ? Do at least 150 minutes of moderate-intensity exercise each week, such as brisk walking or water aerobics. ? Do strength exercises at least 2 times each week.  General instructions  Do not use any products that contain nicotine or tobacco, such as cigarettes, e-cigarettes, and chewing tobacco. If you need help quitting, ask your health care provider.  Keep all follow-up visits as told by your health care provider. This is important. Contact a health care provider if:  Your back pain does not improve after 6 weeks of treatment.  Your symptoms get worse. Get help right away if:  Your back pain is severe.  You are unable to stand or walk.  You develop pain in your legs.  You develop weakness in your buttocks or legs.  You have difficulty controlling when you urinate or when you have a bowel movement. ? You have frequent, painful, or bloody urination. ? You have a temperature over 101.72F (38.3C) Summary  A lumbar strain, which is sometimes called a low-back strain, is a stretch or tear in a muscle or the strong cords of tissue that attach muscle to bone (tendons) in the lower back (lumbar spine).  This type of injury occurs when muscles or tendons are torn or are stretched beyond their limits.  Rest and return to your normal activities as told by your health care provider. If directed, apply heat and ice to the affected area as often as told by your health care provider.  Take over-the-counter and prescription medicines only as told by your health care provider.  Contact a health care provider if you have new or worsening symptoms. This information is not intended to replace advice given to you by your health care provider. Make sure you discuss any questions you have with your health care provider. Document Revised: 10/06/2018 Document Reviewed: 10/06/2018 Elsevier Patient Education  2020  [employer].

## 2020-05-24 NOTE — Progress Notes (Signed)
This visit occurred during the SARS-CoV-2 public health emergency.  Safety protocols were in place, including screening questions prior to the visit, additional usage of staff PPE, and extensive cleaning of exam room while observing appropriate contact time as indicated for disinfecting solutions.    Rodney Brown , 1991/02/08, 29 y.o., male MRN: [id number] Patient Care Team    Relationship Specialty Notifications Start End  Natalia Leatherwood, DO PCP - General Family Medicine  03/16/16     Chief Complaint  Patient presents with   Back Pain    x7 days. Pt unsure of what he did. His job is physical. Lower back and goes into legs. Legs give out at times. Pt took a oxycodone this morning.      Subjective: Pt presents for an OV with complaints of back pain.  Patient reports a little over a week ago he was experiencing left upper cervical pain.  He states he woke up with it on a Sunday morning.  He does not recall any injury or overexertion prior to onset.  He went to a chiropractor on Monday and Wednesday following onset.  He returned to light duty at work that Thursday.  Patient reports the chiropractor adjusted his spine and he initially felt well.  However this past Saturday (7 days ago), his lower back started bothering him.  He states it hurts worse on the right side and midline of his lower back.  He reports he will feel the radiation of the pain down his legs and it makes him feel weak in his legs and he feels like they are going to give out.  He reports the pain is worse when standing up or bending.  He also noted when laying on his stomach and trying to get up the pain was worse.  He denies any constant radiation for pain down his legs.  He denies any numbness or tingling in his lower extremities.  He denies any bladder or bowel dysfunction.  He has used icy hot, Advil, Epson salt soaks and old oxycodone to help with his discomfort.  He states he is worried about work because he had to take  time off for his back and it is not a workers comp case. Patient denies prior back injury or surgery.  Depression screen [facility] 2/9 06/08/2017 06/01/2016  Decreased Interest 1 1  Down, Depressed, Hopeless 2 1  PHQ - 2 Score 3 2  Altered sleeping 3 1  Tired, decreased energy 2 2  Change in appetite 2 0  Feeling bad or failure about yourself  2 0  Trouble concentrating 1 1  Moving slowly or fidgety/restless 0 1  Suicidal thoughts 0 0  PHQ-9 Score 13 7  Difficult doing work/chores - Somewhat difficult    Allergies  Allergen Reactions   Bee Venom Swelling   Social History   Social History Narrative   Single. No children.   Education: HS grad   Employed: [employer]   Drinks caffeine, takes daily vitamin    Wears seatbelt, exercises> 3x week   Smoke detector in the home.    Firearms in the home in a locked case.   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 If you have any questions, ask your nurse or doctor.        clonazePAM 0.5 MG tablet Commonly known as: KLONOPIN Take 0.5-1 tablets (0.25-0.5 mg total) by mouth 2 (two) times daily as needed for anxiety.   DULoxetine 30 MG capsule Commonly known as:  CYMBALTA Take 1 capsule (30 mg total) by mouth daily.   EPINEPHrine 0.3 mg/0.3 mL Soaj injection Commonly known as: EpiPen 2-Pak Inject 0.3 mLs (0.3 mg total) into the muscle once.   methocarbamol 500 MG tablet Commonly known as: Robaxin Take 1 tablet (500 mg total) by mouth 3 (three) times daily. Started by: Felix Pacini, DO   predniSONE 20 MG tablet Commonly known as: DELTASONE 60 mg x3d, 40 mg x3d, 20 mg x2d, 10 mg x2d Started by: Felix Pacini, DO   traMADol 50 MG tablet Commonly known as: ULTRAM Take 1-2 tablets (50-100 mg total) by mouth every 8 (eight) hours as needed for up to 5 days. Started by: Felix Pacini, DO       All past medical history, surgical history, allergies, family history, immunizations andmedications were updated in the EMR today and reviewed under the history and medication portions of their EMR.     ROS: Negative, with the exception of above mentioned in HPI   Objective:  BP 138/80 (BP Location: Left Arm, Patient Position: Sitting, Cuff Size: Normal)    Pulse 93    Temp 97.8 F (36.6 C) (Temporal)    Ht 5\' 7"  (1.702 m)    Wt 181 lb (82.1 kg)    SpO2 95%    BMI 28.35 kg/m  Body mass index is 28.35 kg/m. Gen: Afebrile. No acute distress. Nontoxic in appearance, well developed, well nourished.  HENT: AT. [place].  Eyes:Pupils Equal Round Reactive to light, Extraocular movements intact,  Conjunctiva without redness, discharge or icterus. MSK: No erythema, no soft tissue swelling, no muscle spasm noted.  Tenderness to palpation over bony prominence L4 and L5.  No SI tenderness.  Discomfort with right side bending, right and left rotation, flexion and extension of lumbar spine.  Negative straight leg raises left.  Mild discomfort with right straight leg raise.  Negative Fabre bilaterally.  Muscle strength 5/5 bilateral lower extremities.  Neurovascularly intact distally. DTRs equal distally. Skin: no rashes, purpura or petechiae.  Neuro: Appears uncomfortable  when transitioning positions.  PERLA. EOMi. Alert. Oriented x3  Psych: Normal affect, dress and demeanor. Normal speech. Normal thought content and judgment.  No exam data present No results found. No results found for this or any previous visit (from the past 24 hour(s)).  Assessment/Plan: Rodney Brown is a 29 y.o. male present for OV for  Lumbar radiculopathy Patient with signs and symptoms of lumbar radiculopathy on exam today.  There does not seem to be an enticing event  that he can recall.  Initial onset was left cervical spine discomfort.  Lumbar discomfort followed almost 1 week later possibly related to compensating for upper back discomfort versus strain after chiropractic manipulation. IM Depo-Medrol injection provided today. Muscle relaxers/Robaxin prescribed 3 times daily as needed with caution on sedation.  Patient was encouraged to at least take nightly for 7 days. Rest, heat application can be helpful. Lumbar x-ray ordered today.  Patient will be called with results once available. Tramadol prescribed for pain.  Patient encouraged to avoid using higher dose narcotics unless absolutely needed.  [place] controlled substance database reviewed today. Follow-up in 2 weeks, sooner if worsening.   Reviewed expectations re: course of current medical issues.  Discussed self-management of symptoms.  Outlined signs and symptoms indicating need for more acute intervention.  Patient verbalized understanding and all questions were answered.  Patient received an After-Visit Summary.    Orders Placed This Encounter  Procedures   DG Lumbar Spine Complete   Meds ordered this encounter  Medications   predniSONE (DELTASONE) 20 MG tablet    Sig: 60 mg x3d, 40 mg x3d, 20 mg x2d, 10 mg x2d    Dispense:  18 tablet    Refill:  0   methocarbamol (ROBAXIN) 500 MG tablet    Sig: Take 1 tablet (500 mg total) by mouth 3 (three) times daily.    Dispense:  30 tablet    Refill:   0   traMADol (ULTRAM) 50 MG tablet    Sig: Take 1-2 tablets (50-100 mg total) by mouth every 8 (eight) hours as needed for up to 5 days.    Dispense:  30 tablet    Refill:  0   methylPREDNISolone acetate (DEPO-MEDROL) injection 80 mg   Referral Orders  No referral(s) requested today     Note is dictated utilizing voice recognition software. Although note has been proof read prior to signing, occasional typographical errors still can be missed. If any questions arise, please do not hesitate to call for verification.   electronically signed by:  Howard Pouch, DO  [place]

## 2020-05-27 ENCOUNTER — Telehealth: Payer: Self-pay | Admitting: Family Medicine

## 2020-05-27 NOTE — Telephone Encounter (Signed)
Please inform patient the following information: Please inform patient his lumbar x-ray was normal.   Disc spaces are well-maintained. Lumbar spine was without fracture and well aligned.  Follow-up as discussed, sooner if worsening.

## 2020-05-27 NOTE — Telephone Encounter (Signed)
Pt was called and was given results, he verbalized understanding.

## 2020-06-04 ENCOUNTER — Ambulatory Visit: Payer: BC Managed Care – PPO | Admitting: Family Medicine

## 2020-06-04 DIAGNOSIS — Z0289 Encounter for other administrative examinations: Secondary | ICD-10-CM

## 2020-06-05 ENCOUNTER — Encounter: Payer: Self-pay | Admitting: Family Medicine

## 2020-06-05 ENCOUNTER — Ambulatory Visit (INDEPENDENT_AMBULATORY_CARE_PROVIDER_SITE_OTHER): Payer: BC Managed Care – PPO | Admitting: Family Medicine

## 2020-06-05 ENCOUNTER — Other Ambulatory Visit: Payer: Self-pay

## 2020-06-05 VITALS — BP 138/84 | Temp 98.2°F | Resp 19 | Ht 67.0 in | Wt 182.5 lb

## 2020-06-05 DIAGNOSIS — M5416 Radiculopathy, lumbar region: Secondary | ICD-10-CM | POA: Diagnosis not present

## 2020-06-05 DIAGNOSIS — F418 Other specified anxiety disorders: Secondary | ICD-10-CM | POA: Diagnosis not present

## 2020-06-05 MED ORDER — EPINEPHRINE 0.3 MG/0.3ML IJ SOAJ: 0.3000 mg | Freq: Once | INTRAMUSCULAR | 0 refills | Status: AC

## 2020-06-05 MED ORDER — LORAZEPAM 0.5 MG PO TABS: 0.5000 mg | ORAL_TABLET | Freq: Three times a day (TID) | ORAL | 1 refills | Status: DC

## 2020-06-05 MED ORDER — TRAMADOL HCL 50 MG PO TABS: 50.0000 mg | ORAL_TABLET | Freq: Three times a day (TID) | ORAL | 0 refills | Status: AC | PRN

## 2020-06-05 MED ORDER — DICLOFENAC SODIUM ER 100 MG PO TB24: 100.0000 mg | ORAL_TABLET | Freq: Every day | ORAL | 1 refills | Status: DC

## 2020-06-05 NOTE — Progress Notes (Signed)
Patient ID: TYQUON NEAR, male   DOB: 02-28-1991, 29 y.o.   MRN: [id number]    TRAVIN MARIK , 1991-10-22, 29 y.o., male MRN: [id number]  Chief Complaint  Patient presents with   Depression    Pt would like refill on Klonopin.    Anxiety    Subjective: CHE RACHAL is a 29 y.o. male present for anxiety Depression/anxiety:  He reports he has noticed has had worsening anxiety.  There is been a few occasions over the last 6 months that have created a panic feeling for him.  At one time he had been on Prozac, which he did not feel like it was working well for him.  Cymbalta was prescribed but he admits he only took that once and did not like the way it made him feel.  He had been tried on Effexor in the past and he felt tired.  He had been started on Klonopin low-dose twice daily as needed for panic only and sleep disturbance, last refill 2018.  Patient presents today and states he would like to restart Klonopin.  Lumbar radiculopathy: Patient reports his lower back is much improved from last seen little over a week ago.  He has completed prednisone, took the muscle relaxers as scheduled and tramadol.  He states the tramadol did help his back tremendously.  He is starting to return to activities with caution.  He has concerns about returning to work next week.  His lumbar x-ray was normal. Prior note: Pt presents for an OV with complaints of back pain.  Patient reports a little over a week ago he was experiencing left upper cervical pain.  He states he woke up with it on a Sunday morning.  He does not recall any injury or overexertion prior to onset.  He went to a chiropractor on Monday and Wednesday following onset.  He returned to light duty at work that Thursday.  Patient reports the chiropractor adjusted his spine and he initially felt well.  However this past Saturday (7 days ago), his lower back started bothering him.  He states it hurts worse on the right side and midline of his lower back.   He reports he will feel the radiation of the pain down his legs and it makes him feel weak in his legs and he feels like they are going to give out.  He reports the pain is worse when standing up or bending.  He also noted when laying on his stomach and trying to get up the pain was worse.  He denies any constant radiation for pain down his legs.  He denies any numbness or tingling in his lower extremities.  He denies any bladder or bowel dysfunction.  He has used icy hot, Advil, Epson salt soaks and old oxycodone to help with his discomfort.  He states he is worried about work because he had to take time off for his back and it is not a workers comp case. Patient denies prior back injury or surgery.  Depression screen [facility] 2/9 06/05/2020 06/08/2017 06/01/2016  Decreased Interest 0 1 1  Down, Depressed, Hopeless 0 2 1  PHQ - 2 Score 0 3 2  Altered sleeping 0 3 1  Tired, decreased energy 0 2 2  Change in appetite 0 2 0  Feeling bad or failure about yourself  0 2 0  Trouble concentrating 0 1 1  Moving slowly or fidgety/restless 0 0 1  Suicidal thoughts 0 0 0  PHQ-9  Score 0 13 7  Difficult doing work/chores Not difficult at all - Somewhat difficult    GAD 7 : Generalized Anxiety Score 06/05/2020 06/08/2017 06/01/2016  Nervous, Anxious, on Edge 0 3 2  Control/stop worrying 3 2 2   Worry too much - different things 1 2 2   Trouble relaxing 0 1 2  Restless 0 1 1  Easily annoyed or irritable 1 2 2   Afraid - awful might happen 0 3 2  Total GAD 7 Score 5 14 13   Anxiety Difficulty Not difficult at all Very difficult Somewhat difficult     Allergies  Allergen Reactions   Bee Venom Swelling   Social History   Tobacco Use   Smoking status: Current Every Day Smoker    Packs/day: 0.50    Years: 4.00    Pack years: 2.00    Types: Cigarettes   Smokeless tobacco: Never Used  Substance Use Topics   Alcohol use: Yes    Comment: occasionally   Past Medical History:  Diagnosis Date   Anxiety     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   GERD (gastroesophageal reflux disease)    Migraines    Rash of neck 06/26/2014   Shoulder dislocation, 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 If you have any questions, ask your nurse or doctor.        clonazePAM 0.5 MG tablet Commonly known as: KLONOPIN Take 0.5-1 tablets (0.25-0.5 mg total) by mouth 2 (two) times daily as needed for anxiety.   DULoxetine 30 MG capsule Commonly known as: CYMBALTA Take 1 capsule (30 mg total) by mouth daily.   EPINEPHrine 0.3 mg/0.3 mL Soaj injection Commonly known as: EpiPen 2-Pak Inject 0.3 mLs (0.3 mg total) into the muscle once.   methocarbamol 500 MG tablet Commonly known as: Robaxin Take 1 tablet (500 mg total) by mouth 3 (three) times daily.   predniSONE 20 MG tablet Commonly known as: DELTASONE 60 mg x3d, 40 mg x3d, 20 mg x2d, 10 mg x2d       ROS: Negative, with the exception of above mentioned in HPI  Objective:  BP 138/84 (BP Location: Left Arm, Patient Position: Sitting, Cuff Size: Normal)    Temp 98.2 F (36.8 C) (Temporal)    Resp 19    Ht 5\' 7"  (1.702 m)    Wt 182 lb 8 oz (82.8 kg)    SpO2 96%    BMI 28.58 kg/m  Body mass index is 28.58 kg/m. Gen: Afebrile. No acute distress.  Nontoxic in presentation, well-developed, well-nourished, pleasant male. HENT: AT. [place].  Eyes:Pupils Equal Round Reactive to light, Extraocular movements intact,  Conjunctiva  without redness, discharge or icterus. CV: RRR  Chest: CTAB, no wheeze or crackles Neuro:  Normal gait. PERLA. EOMi. Alert. Oriented x3  Psych: Mildly anxious.  Normal affect,  dress and demeanor. Normal speech. Normal thought content and judgment.   Assessment/Plan: JABRON WEESE is a 28 y.o. male present for OV for  Depression with anxiety -Reoccurring anxiety and panic.  Discussed multiple options of treatment with him today.  He would like to try to avoid a daily medication if possible and feels he would only need to use a short-term medication a few times a month. -Ativan 0.5 mg 3 times daily as needed-only use in times of panic.  He is aware if needing refills he will need to see this provider face-to-face.  He is aware if needing chronic refills he will need to sign a controlled substance contract and we will also need to consider starting a daily medication to help control anxiety. Consider [place] controlled substance database reviewed. -Prior medications: Prozac, Cymbalta, Effexor, Klonopin   Lumbar radiculopathy -Low back pain has improved.  Encouraged him to start slow return to normal activities.  X-ray was reassuring.   Discussed him considering purchasing a lumbar support belt for working hours. -Diclofenac 1 tab daily with meal for the next 4 weeks, then can stop medication.  After 4 weeks use as needed for flares. -Short course of tramadol provided-no refills.  To be used only in times of severe pain -Would encourage start of physical therapy for lower back strengthening.  Referral placed for him today. . Orders Placed This Encounter  Procedures   Ambulatory referral to Physical Therapy   Meds ordered this encounter  Medications   LORazepam (ATIVAN) 0.5 MG tablet    Sig: Take 1 tablet (0.5 mg total) by mouth every 8 (eight) hours.    Dispense:  30 tablet    Refill:  1   Diclofenac Sodium CR 100 MG 24 hr tablet    Sig: Take 1 tablet (100 mg total) by  mouth daily.    Dispense:  90 tablet    Refill:  1   EPINEPHrine (EPIPEN 2-PAK) 0.3 mg/0.3 mL IJ SOAJ injection    Sig: Inject 0.3 mLs (0.3 mg total) into the muscle once for 1 dose.    Dispense:  0.3 mL    Refill:  0   traMADol (ULTRAM) 50 MG tablet    Sig: Take 1 tablet (50 mg total) by mouth every 8 (eight) hours as needed for up to 5 days.    Dispense:  15 tablet    Refill:  0    Referral Orders     Ambulatory referral to Physical Therapy     electronically signed by:  Howard Pouch, DO  Marth[place]

## 2020-06-05 NOTE — Patient Instructions (Addendum)
Start the diclofenac. Take with food. It is a once a day tab only. This helps keep inflammation down and helps with pain.   I have refilled the tramadol short term only.   Ativan 1 tab as needed only for panic or anxiety.   I called in the epi pen. It might be expensive.   If needing any refills on the controlled substances other than what is supplied today> we will need to see you.

## 2020-06-06 ENCOUNTER — Encounter: Payer: Self-pay | Admitting: Family Medicine

## 2020-06-06 ENCOUNTER — Ambulatory Visit: Payer: BC Managed Care – PPO | Admitting: Family Medicine

## 2020-07-08 ENCOUNTER — Other Ambulatory Visit: Payer: Self-pay | Admitting: Family Medicine

## 2020-07-08 NOTE — Telephone Encounter (Signed)
LAST APPOINTMENT DATE: 06/05/2020   NEXT APPOINTMENT DATE: N/A    LAST REFILL: 06/10/2020  QTY: 15

## 2020-07-25 ENCOUNTER — Other Ambulatory Visit: Payer: Self-pay | Admitting: Family Medicine

## 2020-07-25 NOTE — Telephone Encounter (Signed)
Last office visit- 06-05-2020  This medication is not listed on patients med list

## 2021-05-11 ENCOUNTER — Ambulatory Visit
Admission: RE | Admit: 2021-05-11 | Discharge: 2021-05-11 | Disposition: A | Discharge status: Home / Self Care | Payer: BC Managed Care – PPO | Source: Ambulatory Visit | Attending: Family Medicine | Admitting: Family Medicine

## 2021-05-11 ENCOUNTER — Emergency Department (HOSPITAL_COMMUNITY): Payer: BC Managed Care – PPO

## 2021-05-11 ENCOUNTER — Other Ambulatory Visit: Payer: Self-pay

## 2021-05-11 ENCOUNTER — Encounter (HOSPITAL_COMMUNITY): Payer: Self-pay | Admitting: Emergency Medicine

## 2021-05-11 ENCOUNTER — Ambulatory Visit
Admission: EM | Admit: 2021-05-11 | Discharge: 2021-05-11 | Discharge status: Absent without leave | Payer: BC Managed Care – PPO

## 2021-05-11 ENCOUNTER — Emergency Department (HOSPITAL_COMMUNITY)
Admission: EM | Admit: 2021-05-11 | Discharge: 2021-05-11 | Disposition: A | Discharge status: Home / Self Care | Payer: BC Managed Care – PPO | Attending: Emergency Medicine | Admitting: Emergency Medicine

## 2021-05-11 DIAGNOSIS — F17210 Nicotine dependence, cigarettes, uncomplicated: Secondary | ICD-10-CM | POA: Diagnosis not present

## 2021-05-11 DIAGNOSIS — R197 Diarrhea, unspecified: Secondary | ICD-10-CM | POA: Diagnosis present

## 2021-05-11 LAB — CBC WITH DIFFERENTIAL/PLATELET
Abs Immature Granulocytes: 0.02 10*3/uL (ref 0.00–0.07)
Basophils Absolute: 0.1 10*3/uL (ref 0.0–0.1)
Basophils Relative: 1 %
Eosinophils Absolute: 1.2 10*3/uL — ABNORMAL HIGH (ref 0.0–0.5)
Eosinophils Relative: 13 %
HCT: 48.4 % (ref 39.0–52.0)
Hemoglobin: 17 g/dL (ref 13.0–17.0)
Immature Granulocytes: 0 %
Lymphocytes Relative: 25 %
Lymphs Abs: 2.3 10*3/uL (ref 0.7–4.0)
MCH: 32.3 pg (ref 26.0–34.0)
MCHC: 35.1 g/dL (ref 30.0–36.0)
MCV: 92 fL (ref 80.0–100.0)
Monocytes Absolute: 0.7 10*3/uL (ref 0.1–1.0)
Monocytes Relative: 7 %
Neutro Abs: 5.1 10*3/uL (ref 1.7–7.7)
Neutrophils Relative %: 54 %
Platelets: 302 10*3/uL (ref 150–400)
RBC: 5.26 MIL/uL (ref 4.22–5.81)
RDW: 12.1 % (ref 11.5–15.5)
WBC: 9.4 10*3/uL (ref 4.0–10.5)
nRBC: 0 % (ref 0.0–0.2)

## 2021-05-11 LAB — COMPREHENSIVE METABOLIC PANEL
ALT: 20 U/L (ref 0–44)
AST: 20 U/L (ref 15–41)
Albumin: 4.3 g/dL (ref 3.5–5.0)
Alkaline Phosphatase: 73 U/L (ref 38–126)
Anion gap: 7 (ref 5–15)
BUN: 11 mg/dL (ref 6–20)
CO2: 23 mmol/L (ref 22–32)
Calcium: 9.3 mg/dL (ref 8.9–10.3)
Chloride: 105 mmol/L (ref 98–111)
Creatinine, Ser: 0.98 mg/dL (ref 0.61–1.24)
GFR, Estimated: >60 mL/min (ref >60)
Glucose, Bld: 100 mg/dL — ABNORMAL HIGH (ref 70–99)
Potassium: 4 mmol/L (ref 3.5–5.1)
Sodium: 135 mmol/L (ref 135–145)
Total Bilirubin: 0.9 mg/dL (ref 0.3–1.2)
Total Protein: 7.3 g/dL (ref 6.5–8.1)

## 2021-05-11 LAB — LIPASE, BLOOD: Lipase: 25 U/L (ref 11–51)

## 2021-05-11 MED ORDER — DICYCLOMINE HCL 20 MG PO TABS: ORAL_TABLET | ORAL | 0 refills | Status: DC

## 2021-05-11 MED ORDER — SODIUM CHLORIDE 0.9 % IV BOLUS
1000.0000 mL | Freq: Once | INTRAVENOUS | Status: AC
  Administered 2021-05-11: 1000 mL via INTRAVENOUS

## 2021-05-11 NOTE — ED Triage Notes (Signed)
Past 2 weeks has been having diarrhea 6-7 diarrhea and gas no matter what he eats.  No fevers

## 2021-05-11 NOTE — ED Provider Notes (Signed)
[facility] EMERGENCY DEPARTMENT Provider Note   CSN: [id number] Arrival date & time: 05/11/21  1445     History Chief Complaint  Patient presents with  . Diarrhea    Rodney Brown is a 30 y.o. male.  Patient complains of diarrhea for a number weeks.  And abdominal cramping no blood in his diarrhea no vomiting.  He has not followed up with any doctor  The history is provided by the patient and medical records. No language interpreter was used.  Diarrhea Severity:  Moderate Onset quality:  Sudden Timing:  Constant Progression:  Worsening Relieved by:  Nothing Worsened by:  Nothing Ineffective treatments:  None tried Associated symptoms: no abdominal pain and no headaches        Past Medical History:  Diagnosis Date  . Anxiety   . 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 GERD (gastroesophageal reflux disease)   . Migraines   . Rash of neck 06/26/2014  . 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 Fatigue 05/29/2016  . Depression with anxiety 05/29/2016  . Frequent headaches 04/28/2016  . Left knee pain 03/16/2016  . Decreased range of motion of right shoulder 08/30/2014  . 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 INCISION AND DRAINAGE ABSCESS N/A 05/27/2017   Procedure: INCISION AND DRAINAGE  PERINEAL ABSCESS;  Surgeon: Glenna Fellows, MD;  Location: [facility];  Service: General;  Laterality: N/A;  . LEG SURGERY Right 2013  . SHOULDER SURGERY Right    x 2  . WISDOM TOOTH EXTRACTION         Family History  Problem Relation Age of Onset  . Stroke Father     Social History   Tobacco Use  . Smoking status: Current Every Day Smoker    Packs/day: 0.50    Years: 4.00    Pack years: 2.00    Types: Cigarettes  . Smokeless tobacco: Never Used  Vaping Use  . Vaping Use: Never used  Substance Use Topics  . Alcohol use:  Yes    Comment: occasionally  . Drug use: No    Home Medications Prior to Admission medications   Medication Sig Start Date End Date Taking? Authorizing Provider  dicyclomine (BENTYL) 20 MG tablet Take 1 every 8 hours as needed for abdominal cramping 05/11/21  Yes Bethann Berkshire, MD  Diclofenac Sodium CR 100 MG 24 hr tablet Take 1 tablet (100 mg total) by mouth daily. 06/05/20   Kuneff, Renee A, DO  LORazepam (ATIVAN) 0.5 MG tablet Take 1 tablet (0.5 mg total) by mouth every 8 (eight) hours. 06/05/20   Kuneff, Renee A, DO    Allergies    Bee venom  Review of Systems   Review of Systems  Constitutional: Negative for appetite change and fatigue.  HENT: Negative for congestion, ear discharge and sinus pressure.   Eyes: Negative for discharge.  Respiratory: Negative for cough.   Cardiovascular: Negative for chest pain.  Gastrointestinal: Positive for diarrhea. Negative for abdominal pain.  Genitourinary: Negative for frequency and hematuria.  Musculoskeletal: Negative for back pain.  Skin: Negative for rash.  Neurological: Negative for seizures and headaches.  Psychiatric/Behavioral: Negative for hallucinations.    Physical Exam Updated Vital Signs BP 120/77   Pulse 66   Temp 98.4 F (36.9 C) (Oral)   Resp 18   Ht 5\' 7"  (1.702 m)  Wt 77.1 kg   SpO2 98%   BMI 26.63 kg/m   Physical Exam Vitals and nursing note reviewed.  Constitutional:      Appearance: He is well-developed.  HENT:     Head: Normocephalic.     Nose: Nose normal.  Eyes:     General: No scleral icterus.    Conjunctiva/sclera: Conjunctivae normal.  Neck:     Thyroid: No thyromegaly.  Cardiovascular:     Rate and Rhythm: Normal rate and regular rhythm.     Heart sounds: No murmur heard. No friction rub. No gallop.   Pulmonary:     Breath sounds: No stridor. No wheezing or rales.  Chest:     Chest wall: No tenderness.  Abdominal:     General: There is no distension.     Tenderness: There is no  abdominal tenderness. There is no rebound.  Musculoskeletal:        General: Normal range of motion.     Cervical back: Neck supple.  Lymphadenopathy:     Cervical: No cervical adenopathy.  Skin:    Findings: No erythema or rash.  Neurological:     Mental Status: He is alert and oriented to person, place, and time.     Motor: No abnormal muscle tone.     Coordination: Coordination normal.  Psychiatric:        Behavior: Behavior normal.     ED Results / Procedures / Treatments   Labs (all labs ordered are listed, but only abnormal results are displayed) Labs Reviewed  COMPREHENSIVE METABOLIC PANEL - Abnormal; Notable for the following components:      Result Value   Glucose, Bld 100 (*)    All other components within normal limits  CBC WITH DIFFERENTIAL/PLATELET - Abnormal; Notable for the following components:   Eosinophils Absolute 1.2 (*)    All other components within normal limits  LIPASE, BLOOD    EKG None  Radiology DG ABD ACUTE 2+V W 1V CHEST  Result Date: 05/11/2021 CLINICAL DATA:  Diarrhea x2 weeks. EXAM: DG ABDOMEN ACUTE WITH 1 VIEW CHEST COMPARISON:  None. FINDINGS: There is no evidence of dilated bowel loops or free intraperitoneal air. No radiopaque calculi or other significant radiographic abnormality is seen. Heart size and mediastinal contours are within normal limits. Both lungs are clear. Radiopaque surgical screws are seen overlying the right glenoid. IMPRESSION: Negative abdominal radiographs.  No acute cardiopulmonary disease. Electronically Signed   By: Aram Candela M.D.   On: 05/11/2021 16:37    Procedures Procedures   Medications Ordered in ED Medications  sodium chloride 0.9 % bolus 1,000 mL (0 mLs Intravenous Stopped 05/11/21 1817)    ED Course  I have reviewed the triage vital signs and the nursing notes.  Pertinent labs & imaging results that were available during my care of the patient were reviewed by me and considered in my medical  decision making (see chart for details).    MDM Rules/Calculators/A&P                          Patient with persistent diarrhea for a number of weeks.  He is placed on Bentyl and referred to GI Final Clinical Impression(s) / ED Diagnoses Final diagnoses:  Diarrhea, unspecified type    Rx / DC Orders ED Discharge Orders         Ordered    dicyclomine (BENTYL) 20 MG tablet        05/11/21  Beverly Sessions, MD 05/13/21 1008

## 2021-05-11 NOTE — Discharge Instructions (Signed)
Call make an appointment with Dr. Levon Hedger or one of his partners in the next couple weeks.

## 2021-05-11 NOTE — ED Triage Notes (Signed)
Pt c/o diarrhea x 2 weeks 6-7 times a day.

## 2021-05-11 NOTE — ED Provider Notes (Signed)
Emergency Medicine Provider Triage Evaluation Note  Rodney Brown , a 30 y.o. male  was evaluated in triage.  Pt complains of 2-week history of copious nonbloody diarrhea, 6-7 times per day.  Intermittent abdominal pain, denies nausea vomiting fever.  No recent antibiotic use, foreign travel, risk for food poisoning.  Review of Systems  Positive: Diarrhea and abdominal pain Negative: Fever, nausea or vomiting  Physical Exam  BP (!) 147/106 (BP Location: Right Arm)   Pulse 78   Temp 98.4 F (36.9 C) (Oral)   Resp 18   Ht 5\' 7"  (1.702 m)   Wt 77.1 kg   SpO2 98%   BMI 26.63 kg/m  Gen:   Awake, no distress   Resp:  Normal effort  MSK:   Moves extremities without difficulty  Other:    Medical Decision Making  Medically screening exam initiated at 3:25 PM.  Appropriate orders placed.  Laszlo Ellerby Bransfield was informed that the remainder of the evaluation will be completed by another provider, this initial triage assessment does not replace that evaluation, and the importance of remaining in the ED until their evaluation is complete.  Basic labs obtained at this time to assess for electrolyte abnormalities and dehydration.   Pearson Forster, PA-C 05/11/21 1527    05/13/21, MD 05/13/21 [phone]

## 2021-05-29 ENCOUNTER — Encounter: Payer: Self-pay | Admitting: Family Medicine

## 2021-05-29 ENCOUNTER — Other Ambulatory Visit: Payer: Self-pay

## 2021-05-29 ENCOUNTER — Ambulatory Visit (INDEPENDENT_AMBULATORY_CARE_PROVIDER_SITE_OTHER): Payer: BC Managed Care – PPO | Admitting: Family Medicine

## 2021-05-29 VITALS — BP 116/80 | HR 59 | Temp 98.1°F | Ht 67.0 in | Wt 174.0 lb

## 2021-05-29 DIAGNOSIS — K529 Noninfective gastroenteritis and colitis, unspecified: Secondary | ICD-10-CM | POA: Diagnosis not present

## 2021-05-29 DIAGNOSIS — R1013 Epigastric pain: Secondary | ICD-10-CM | POA: Diagnosis not present

## 2021-05-29 LAB — H. PYLORI ANTIBODY, IGG: H Pylori IgG: NEGATIVE

## 2021-05-29 LAB — LIPASE: Lipase: 16 U/L (ref 11.0–59.0)

## 2021-05-29 LAB — C-REACTIVE PROTEIN: CRP: <1 mg/dL (ref 0.5–20.0)

## 2021-05-29 LAB — TSH: TSH: 0.42 u[IU]/mL (ref 0.35–4.50)

## 2021-05-29 LAB — SEDIMENTATION RATE: Sed Rate: 10 mm/hr (ref 0–15)

## 2021-05-29 MED ORDER — OMEPRAZOLE 40 MG PO CPDR: 40.0000 mg | DELAYED_RELEASE_CAPSULE | Freq: Every day | ORAL | 1 refills | Status: DC

## 2021-05-29 NOTE — Patient Instructions (Addendum)
We will call you with lab results.  Start omeprazole daily before bed- this is for stomach.   Complete the stool studies as instructed and drop off.   I have referred you to gastro doc as well.  Stop energy drinks and alcohol.

## 2021-05-29 NOTE — Progress Notes (Signed)
This visit occurred during the SARS-CoV-2 public health emergency.  Safety protocols were in place, including screening questions prior to the visit, additional usage of staff PPE, and extensive cleaning of exam room while observing appropriate contact time as indicated for disinfecting solutions.    Rodney Brown , 1991-11-05, 30 y.o., male MRN: [id number] Patient Care Team    Relationship Specialty Notifications Start End  Rodney Hillock, DO PCP - General Family Medicine  03/16/16     Chief Complaint  Patient presents with   Diarrhea    Pt still experiencing diarrhea after being seen in ED     Subjective: Pt presents for an OV with complaints of diarrhea of 4 weeks duration.  Associated symptoms include gassy.  Patient states he had been using the bathroom up to 8 times a day of watery loose stools.  Over the last week it has slowed down, however he he is still having multiple stools a day that are watery and loose.  He states occasionally he has had a "normal "bowel movement within the last 4 weeks.  His stomach is full of air, he feels gassy and there is a a lot of "rumbling "in his stomach.  He does endorse rather significant "heartburn ". He states sometimes even water will cause him to have heartburn.  He denies any abdominal pain or nausea after eating.  He states certain foods now only can have fecal urgency and cramping while eating or shortly thereafter. Pt denies exposure  to insects, recent travel, under prepared foods, antibiotics  or sick contacts.  He denies dietary changes over this time.  He denies fever, chills, vomit or hematochezia.  He does endorse drinking an energy drink packet, that was started proximately 3-4 weeks ago.  He is uncertain the content or herbal content of this packet.  He does consume alcohol multiple times throughout the week.  He states he has cut back on his alcohol consumption to see if it was playing a role in his diarrhea.  He states if he does  drink any alcohol he notices it does make his stools more loose.  He also reports that he has had occasional pains in the upper middle portion of his stomach and left upper portion of his stomach.  He states they will be sharp gnawing pains.  He does have history of anxiety.  He is currently not on medications for his anxiety. There is no family history of IBD/IBS or colon cancers.  He was seen in the ED for this condition a few days ago and was provided with Bentyl.  He states this made him sleepy.    Depression screen [facility] 2/9 05/29/2021 06/05/2020 06/08/2017 06/01/2016  Decreased Interest 0 0 1 1  Down, Depressed, Hopeless 0 0 2 1  PHQ - 2 Score 0 0 3 2  Altered sleeping - 0 3 1  Tired, decreased energy - 0 2 2  Change in appetite - 0 2 0  Feeling bad or failure about yourself  - 0 2 0  Trouble concentrating - 0 1 1  Moving slowly or fidgety/restless - 0 0 1  Suicidal thoughts - 0 0 0  PHQ-9 Score - 0 13 7  Difficult doing work/chores - Not difficult at all - Somewhat difficult    Allergies  Allergen Reactions   Bee Venom Swelling   Social History   Social History Narrative   Single. No children.   Education: HS grad  Employed: [employer]   Drinks caffeine, takes daily vitamin    Wears seatbelt, exercises> 3x week   Smoke detector in the home.    Firearms in the home in a locked case.   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 Surgical screws-glenoid present   WISDOM TOOTH  EXTRACTION     Family History  Problem Relation Age of Onset   Stroke Father    Allergies as of 05/29/2021       Reactions   Bee Venom Swelling        Medication List        Accurate as of May 29, 2021  1:29 PM. If you have any questions, ask your nurse or doctor.          STOP taking these medications    Diclofenac Sodium CR 100 MG 24 hr tablet Stopped by: Rodney Pouch, DO   LORazepam 0.5 MG tablet Commonly known as: Ativan Stopped by: Rodney Pouch, DO       TAKE these medications    dicyclomine 20 MG tablet Commonly known as: BENTYL Take 1 every 8 hours as needed for abdominal cramping   omeprazole 40 MG capsule Commonly known as: PRILOSEC Take 1 capsule (40 mg total) by mouth at bedtime. Started by: Rodney Pouch, DO        All past medical history, surgical history, allergies, family history, immunizations andmedications were updated in the EMR today and reviewed under the history and medication portions of their EMR.     ROS: Negative, with the exception of above mentioned in HPI   Objective:  BP 116/80   Pulse (!) 59   Temp 98.1 F (36.7 C) (Oral)   Ht $R'5\' 7"'aJ$  (1.702 m)   Wt 174 lb (78.9 kg)   SpO2 100%   BMI 27.25 kg/m  Body mass index is 27.25 kg/m. Gen: Afebrile. No acute distress. Nontoxic in appearance, well developed, well nourished.  HENT: AT. [place].  Eyes:Pupils Equal Round Reactive to light, Extraocular movements intact,  Conjunctiva without redness, discharge or icterus. Neck/lymp/endocrine: Supple, no lymphadenopathy, no thyromegaly CV: RRR, no edema Chest: CTAB, no wheeze or crackles. Good air movement, normal resp effort.  Abd: Soft.  Flat. ND.  Mild-moderately tender to palpation epigastric and left upper quadrant.  BS hyperactive.  No masses palpated. No rebound or guarding.  Skin: No rashes, purpura or petechiae.  Neuro:  Normal gait. PERLA. EOMi. Alert. Oriented x3  Psych: Normal affect, dress and demeanor. Normal speech.  Normal thought content and judgment.  No results found. No results found. No results found for this or any previous visit (from the past 24 hour(s)).  Assessment/Plan: [name] is a 30 y.o. male present for OV for  Chronic diarrhea/epigastric pain/left upper quadrant pain -No unintentional weight loss is reassuring as well as normal electrolytes.  He is rather tender in the epigastric and left upper quadrant region. -We will  start work-up surrounding differential diagnosis of pancreatitis, gastritis, colitis/IBD/IBS and pancreatic insufficiency. -CRP, ESR, lipase, celiac panel, TSH and H. pylori IgG collected today.  If H. pylori IgG positive would assume first-time positive since he has not had this condition in the past or been tested.  Would elect to treat for H. pylori in that case. -Stool studies: Stool culture, C. difficile, fecal fat, fecal leuks, ova parasites -Encouraged him to take the Bentyl half tab prior to meals and before bed.  He will report back to [place] on if this was helpful or cause sedation.    -Depending upon results: could consider amitriptyline nightly if felt to be IBS in nature.  Consider Levsin, probiotic., cholestyramine, pancreatic imaging -Start omeprazole 40 mg nightly.  Avoid dietary triggers.  Will consider adding Carafate once labs received. -Avoid alcohol consumption-suspect gastritis may be secondary to alcohol consumption. -Avoid herbal supplements, including packets/energy drinks. Referral to gastroenterology also placed. Close follow-up will be scheduled with this provider once lab results received.  Reviewed expectations re: course of current medical issues. Discussed self-management of symptoms. Outlined signs and symptoms indicating need for more acute intervention. Patient verbalized understanding and all questions were answered. Patient received an After-Visit Summary.    Orders Placed This Encounter  Procedures   Stool Culture   Ova and  parasite examination   Fecal Fat, Qualitative   Fecal leukocytes   Clostridium Difficile by PCR([facility])   Lipase   TSH   Sedimentation rate   C-reactive protein   Gliadin antibodies, serum   Tissue transglutaminase, IgA   Reticulin Antibody, IgA w reflex titer   H. pylori antibody, IgG   Ambulatory referral to Gastroenterology   Meds ordered this encounter  Medications   omeprazole (PRILOSEC) 40 MG capsule    Sig: Take 1 capsule (40 mg total) by mouth at bedtime.    Dispense:  90 capsule    Refill:  1   Referral Orders  Ambulatory referral to Gastroenterology     Note is dictated utilizing voice recognition software. Although note has been proof read prior to signing, occasional typographical errors still can be missed. If any questions arise, please do not hesitate to call for verification.   electronically signed by:  Rodney Pouch, DO  [place]

## 2021-05-30 ENCOUNTER — Telehealth: Payer: Self-pay | Admitting: Family Medicine

## 2021-05-30 MED ORDER — SUCRALFATE 1 G PO TABS: 1.0000 g | ORAL_TABLET | Freq: Three times a day (TID) | ORAL | 0 refills | Status: DC

## 2021-05-30 NOTE — Telephone Encounter (Signed)
Please call patient: -His inflammatory markers are normal. -His pancreatic enzyme is normal. -Thyroid levels normal -H. pylori test is negative  Remainder labs are still pending and I will not get them back until Monday. Please remind him to drop off the stool studies    Start omeprazole daily as we discussed.  This was called in for him the day of his appointment.  Continue for at least 3 months. I have also called in another medication called Carafate he is to take 1 tab before each meal and before bed for at least 2 weeks.   He was also referred to gastroenterology during his appointment, please make sure he has contact information to call them and schedule.

## 2021-05-30 NOTE — Telephone Encounter (Signed)
Spoke with pt regarding labs and instructions.   

## 2021-06-10 ENCOUNTER — Telehealth: Payer: Self-pay

## 2021-06-10 ENCOUNTER — Ambulatory Visit: Payer: BC Managed Care – PPO

## 2021-06-10 ENCOUNTER — Other Ambulatory Visit: Payer: Self-pay

## 2021-06-10 DIAGNOSIS — K529 Noninfective gastroenteritis and colitis, unspecified: Secondary | ICD-10-CM

## 2021-06-10 NOTE — Telephone Encounter (Signed)
Patient has been out of work today and yesterday due to same reasons as he discussed with Dr.Kuneff a couple of weeks.  He will be dropping by stool sample and wanted to know if he could pick up work excuse.  I told patient that Dr. Claiborne Billings is in patient care and not sure if note would be ready when he came to office.  He understood.  I told him I would send note back and her clinical staff could call him when/if request could be approved for a work note.  Work excuse for dates 6/20 & 6/21  Patient can be reached at [phone]

## 2021-06-10 NOTE — Telephone Encounter (Signed)
Please advise 

## 2021-06-10 NOTE — Progress Notes (Signed)
Patient dropped off fecal studies for the lab.

## 2021-06-10 NOTE — Telephone Encounter (Signed)
Please inform patient: I will provide a note this time for those 2 days.  However, any other missed work days would need an appointment in order to provide work excuse.

## 2021-06-11 NOTE — Telephone Encounter (Signed)
Spoke with pt with providers instructions. Pt will pick up note from FO.

## 2021-06-13 LAB — OVA AND PARASITE EXAMINATION

## 2021-06-13 LAB — FECAL LEUKOCYTES

## 2021-06-13 LAB — TEST AUTHORIZATION

## 2021-06-13 LAB — CLOSTRIDIUM DIFFICILE TOXIN B, QUALITATIVE, REAL-TIME PCR: Toxigenic C. Difficile by PCR: NOT DETECTED

## 2021-06-13 LAB — FECAL FAT, QUALITATIVE: FECAL FAT, QUALITATIVE: NORMAL

## 2021-06-23 LAB — STOOL CULTURE

## 2021-06-23 LAB — SPECIMEN STATUS REPORT

## 2021-06-23 LAB — CLOSTRIDIUM DIFFICILE BY PCR

## 2021-07-03 LAB — TISSUE TRANSGLUTAMINASE, IGA: (tTG) Ab, IgA: <1 U/mL

## 2021-07-03 LAB — RETICULIN ANTIBODIES, IGA W TITER

## 2021-07-03 LAB — GLIADIN ANTIBODIES, SERUM
Gliadin IgA: <1 U/mL
Gliadin IgG: <1 U/mL

## 2021-07-18 ENCOUNTER — Ambulatory Visit: Payer: BC Managed Care – PPO | Admitting: Gastroenterology

## 2022-02-16 IMAGING — DX DG ABDOMEN ACUTE W/ 1V CHEST
3 series · 3 of 3 positions shown · non-contrast
Comparison: None.

CLINICAL DATA: Diarrhea x2 weeks.

EXAM:
DG ABDOMEN ACUTE WITH 1 VIEW CHEST

[chest pa]
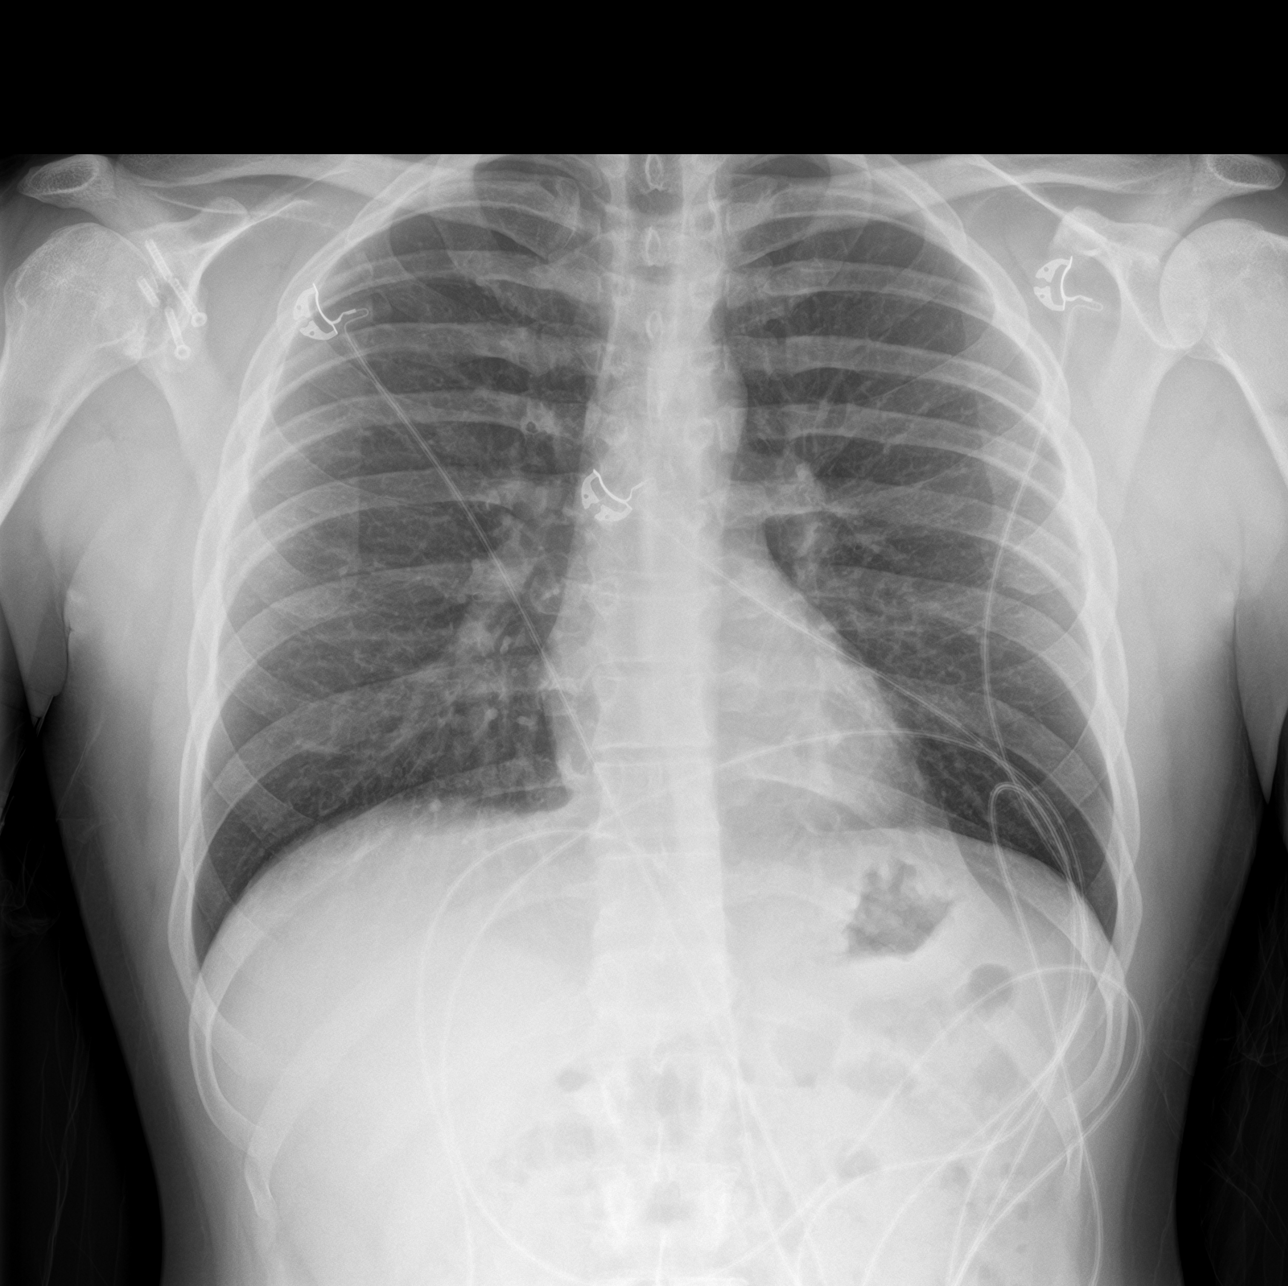

[abdomen erect]
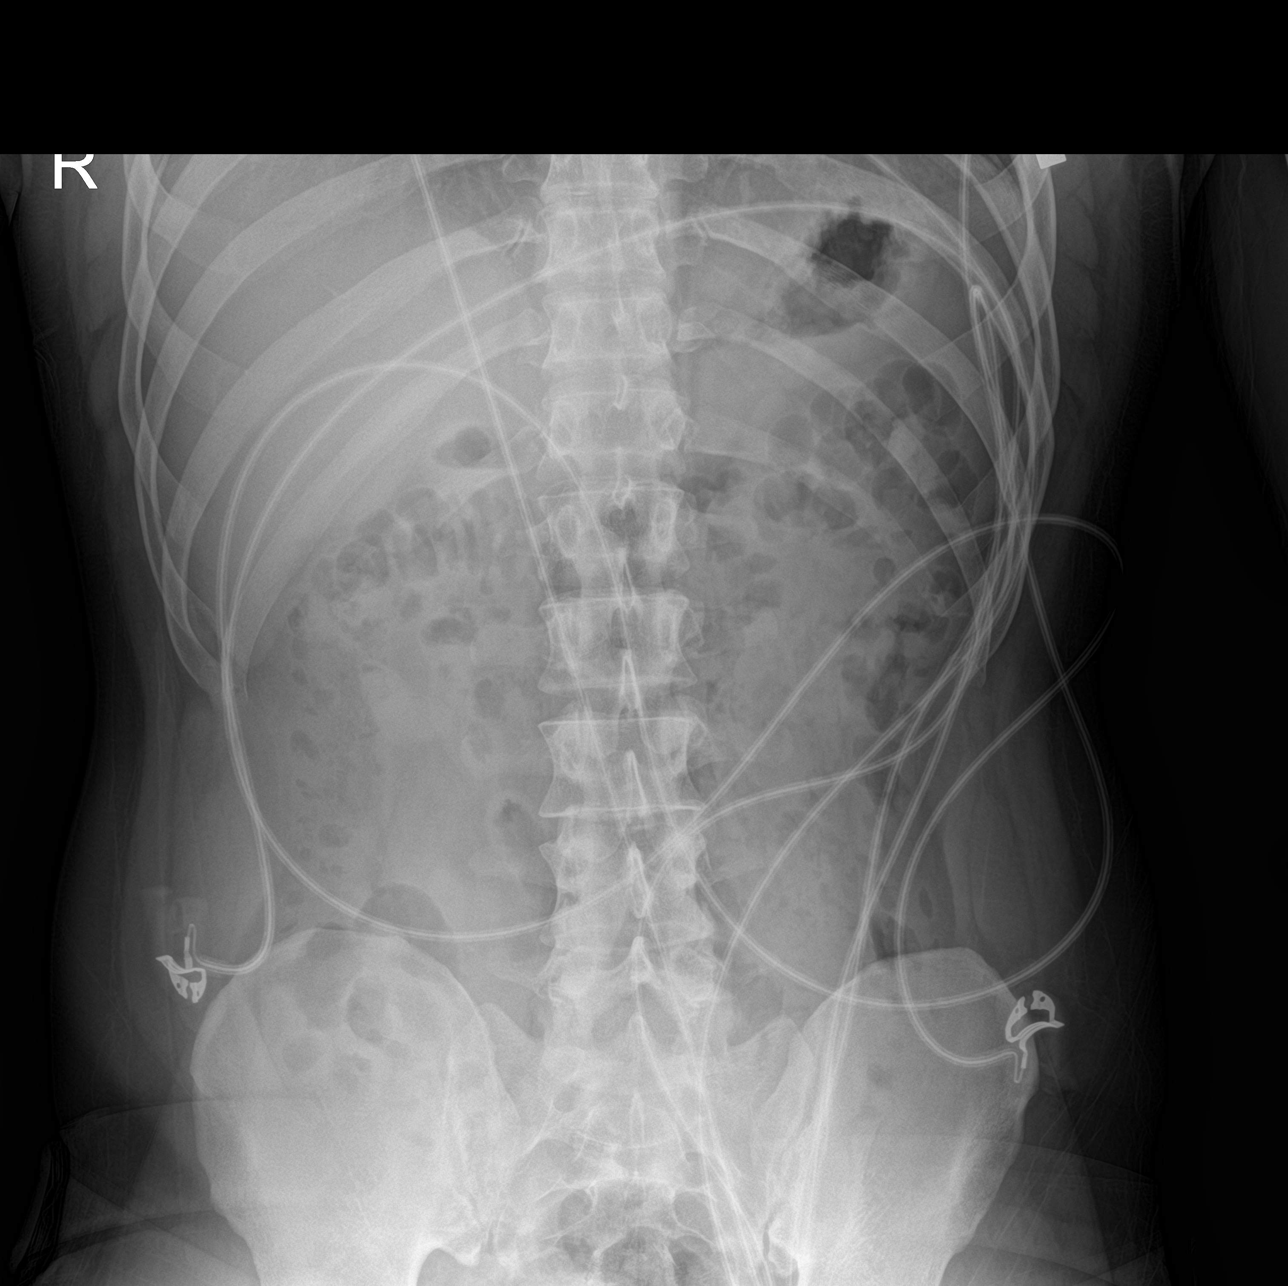

[abdomen supine]
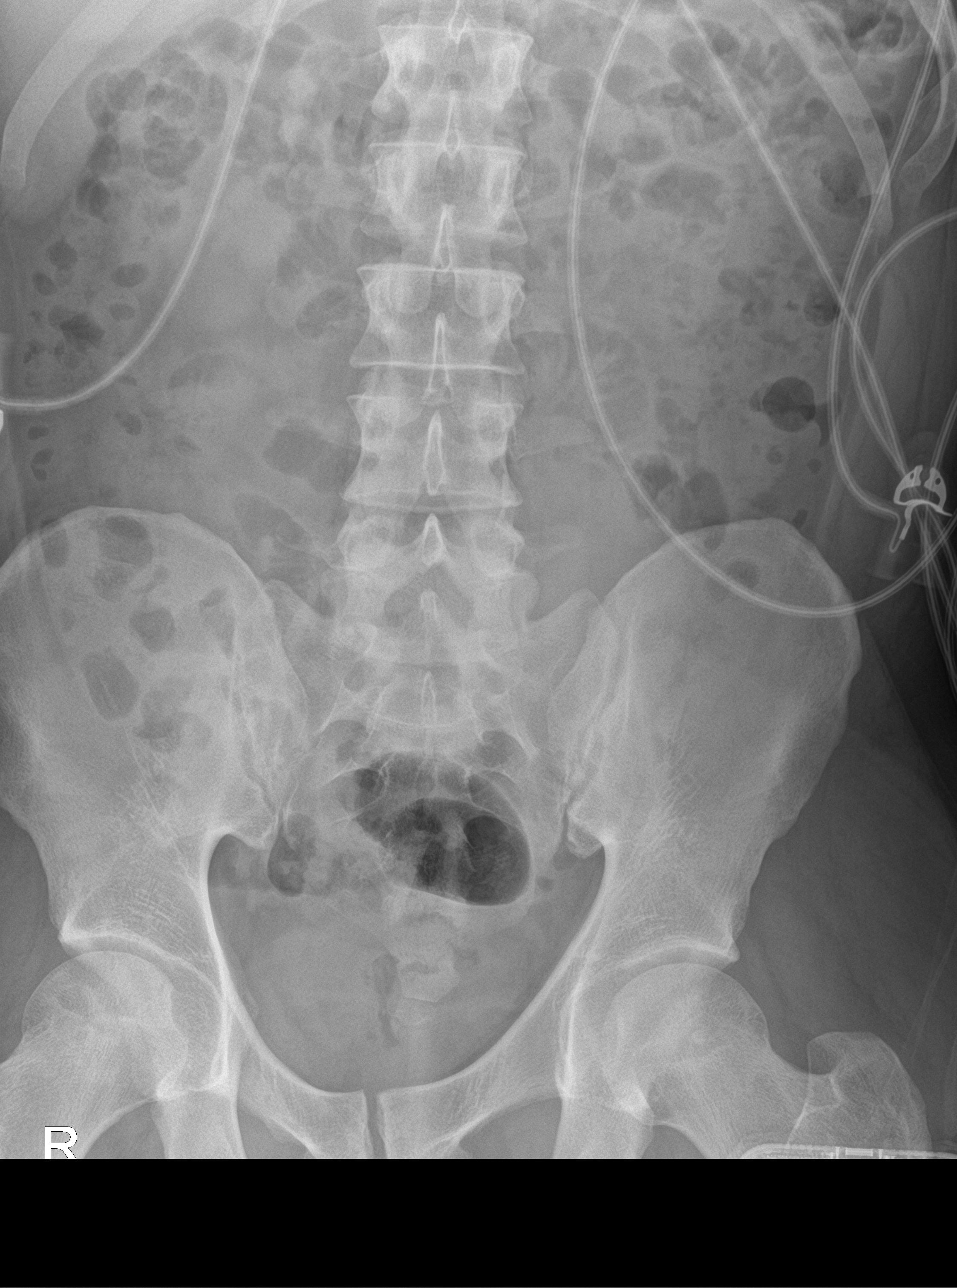

[3 of 3 positions shown; findings below may reference images not displayed]

FINDINGS: There is no evidence of dilated bowel loops or free intraperitoneal
air. No radiopaque calculi or other significant radiographic
abnormality is seen. Heart size and mediastinal contours are within
normal limits. Both lungs are clear. Radiopaque surgical screws are
seen overlying the right glenoid.
IMPRESSION: Negative abdominal radiographs.  No acute cardiopulmonary disease.

## 2023-05-06 ENCOUNTER — Ambulatory Visit (INDEPENDENT_AMBULATORY_CARE_PROVIDER_SITE_OTHER): Payer: Self-pay | Admitting: Family Medicine

## 2023-05-06 ENCOUNTER — Encounter: Payer: Self-pay | Admitting: Family Medicine

## 2023-05-06 VITALS — BP 125/83 | HR 64 | Temp 98.0°F | Wt 176.6 lb

## 2023-05-06 DIAGNOSIS — R519 Headache, unspecified: Secondary | ICD-10-CM

## 2023-05-06 MED ORDER — SUMATRIPTAN SUCCINATE 50 MG PO TABS: 50.0000 mg | ORAL_TABLET | ORAL | 11 refills | Status: AC | PRN

## 2023-05-06 NOTE — Patient Instructions (Addendum)
Return for cpe (20 min).  You are due for your physical.  This should be completed yearly.  Yearly for physicals consist of preventative care only.  Blood work, screenings and vaccinations for preventative care are completed during these appointments.      Great to see you today.   If labs were collected, we will inform you of lab results once received either by echart message or telephone call.   - echart message- for normal results that have been seen by the patient already.   - telephone call: abnormal results or if patient has not viewed results in their echart.

## 2023-05-06 NOTE — Progress Notes (Signed)
Rodney Brown , 1991/07/03, 32 y.o., male MRN: [id number] Patient Care Team    Relationship Specialty Notifications Start End  Natalia Leatherwood, DO PCP - General Family Medicine  03/16/16     Chief Complaint  Patient presents with   Photophobia    Pt drives a lot for work and lots of light/sunlight triggers migraines     Subjective: Rodney Brown is a 32 y.o. Pt presents for an OV with complaints of photophobia/light sensitivity that triggers his migraines. He has a [place] DMV form for a medical exemption to allow tinting of his windows in his vehicle. 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  He has taken imitrex in the past for his migraines. He would like refills.      05/29/2021    9:18 AM 06/05/2020    3:58 PM 06/08/2017    8:06 AM 06/01/2016    5:32 PM  Depression screen PHQ 2/9  Decreased Interest 0 0 1 1  Down, Depressed, Hopeless 0 0 2 1  PHQ - 2 Score 0 0 3 2  Altered sleeping  0 3 1  Tired, decreased energy  0 2 2  Change in appetite  0 2 0  Feeling bad or failure about yourself   0 2 0  Trouble concentrating  0 1 1  Moving slowly or fidgety/restless  0 0 1  Suicidal thoughts  0 0 0  PHQ-9 Score  0 13 7  Difficult doing work/chores  Not difficult at all  Somewhat difficult    Allergies  Allergen Reactions   Bee Venom Swelling   Social History   Social History Narrative   Single. No children.   Education: HS grad   Employed: [employer]   Drinks caffeine, takes daily vitamin    Wears seatbelt, exercises> 3x week   Smoke detector in the home.    Firearms in the home in a locked case.   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 Surgical screws-glenoid present   WISDOM TOOTH EXTRACTION     Family History  Problem Relation Age of Onset   Heart disease Mother    Stroke Father    Allergies as of 05/06/2023       Reactions   Bee Venom Swelling        Medication List        Accurate as of May 06, 2023 10:51 AM. If you have any questions, ask your nurse or doctor.          STOP taking these medications    dicyclomine 20 MG tablet Commonly known as: BENTYL Stopped by: Felix Pacini, DO   omeprazole 40 MG capsule Commonly known as: PRILOSEC Stopped by: Felix Pacini, DO   sucralfate 1 g tablet Commonly known as:  Carafate Stopped by: Felix Pacini, DO       TAKE these medications    SUMAtriptan 50 MG tablet Commonly known as: Imitrex Take 1 tablet (50 mg total) by mouth every 2 (two) hours as needed for migraine. May repeat in 2 hours if headache persists or recurs. Started by: Felix Pacini, DO        All past medical history, surgical history, allergies, family history, immunizations andmedications were updated in the EMR today and reviewed under the history and medication portions of their EMR.     ROS Negative, with the exception of above mentioned in HPI   Objective:  BP 125/83   Pulse 64   Temp 98 F (36.7 C)   Wt 176 lb 9.6 oz (80.1 kg)   SpO2 98%   BMI 27.66 kg/m  Body mass index is 27.66 kg/m. Physical Exam Vitals and nursing note reviewed.  Constitutional:      General: He is not in acute distress.    Appearance: Normal appearance. He is not ill-appearing, toxic-appearing or diaphoretic.  HENT:     Head: Normocephalic and atraumatic.  Eyes:     General: No scleral icterus.        Right eye: No discharge.        Left eye: No discharge.     Extraocular Movements: Extraocular movements intact.     Pupils: Pupils are equal, round, and reactive to light.  Cardiovascular:     Rate and Rhythm: Normal rate and regular rhythm.  Pulmonary:     Effort: Pulmonary effort is normal. No respiratory distress.     Breath sounds: Normal breath sounds. No wheezing, rhonchi or rales.  Musculoskeletal:     Right lower leg: No edema.     Left lower leg: No edema.  Skin:    General: Skin is warm.     Findings: No rash.  Neurological:     Mental Status: He is alert and oriented to person, place, and time. Mental status is at baseline.  Psychiatric:        Mood and Affect: Mood normal.        Behavior: Behavior normal.        Thought Content: Thought content normal.        Judgment: Judgment normal.    No results found. No results found. No results found for this or any previous visit (from the past 24 hour(s)).  Assessment/Plan: Rodney Brown is a 32 y.o. male present for OV for  Frequent headaches Completed form for medical exemption to allow him to have tinted windows in his vehicle 2/2 to photosensitive/migraine history.  Imitrex prescription updated for him.  F/u prn on headaches Encouraged him to schedule a CPE.   Reviewed expectations re: course of current medical issues. Discussed self-management of symptoms. Outlined signs and symptoms indicating need for more acute intervention. Patient verbalized understanding and all questions were answered. Patient received an After-Visit Summary.    No orders of the defined types were placed in this encounter.  Meds ordered this encounter  Medications   SUMAtriptan (IMITREX) 50 MG tablet    Sig: Take 1 tablet (50 mg total) by mouth every 2 (two) hours as needed for migraine. May repeat in 2 hours if headache persists or recurs.    Dispense:  10 tablet    Refill:  11   Referral Orders  No referral(s) requested today      Note is dictated utilizing voice recognition software. Although note  has been proof read prior to signing, occasional typographical errors still can be missed. If any questions arise, please do not hesitate to call for verification.   electronically signed by:  Howard Pouch, DO  [place]

## 2023-05-25 ENCOUNTER — Ambulatory Visit (INDEPENDENT_AMBULATORY_CARE_PROVIDER_SITE_OTHER): Payer: Self-pay | Admitting: Family Medicine

## 2023-05-25 ENCOUNTER — Encounter: Payer: Self-pay | Admitting: Family Medicine

## 2023-05-25 DIAGNOSIS — Z0000 Encounter for general adult medical examination without abnormal findings: Secondary | ICD-10-CM

## 2023-05-25 DIAGNOSIS — Z1159 Encounter for screening for other viral diseases: Secondary | ICD-10-CM

## 2023-05-25 DIAGNOSIS — Z823 Family history of stroke: Secondary | ICD-10-CM | POA: Insufficient documentation

## 2023-05-25 DIAGNOSIS — Z13220 Encounter for screening for lipoid disorders: Secondary | ICD-10-CM

## 2023-05-25 DIAGNOSIS — Z8249 Family history of ischemic heart disease and other diseases of the circulatory system: Secondary | ICD-10-CM

## 2023-05-25 DIAGNOSIS — Z23 Encounter for immunization: Secondary | ICD-10-CM

## 2023-05-25 DIAGNOSIS — Z91199 Patient's noncompliance with other medical treatment and regimen due to unspecified reason: Secondary | ICD-10-CM

## 2023-05-25 DIAGNOSIS — Z131 Encounter for screening for diabetes mellitus: Secondary | ICD-10-CM

## 2023-05-25 DIAGNOSIS — Z113 Encounter for screening for infections with a predominantly sexual mode of transmission: Secondary | ICD-10-CM

## 2023-05-25 DIAGNOSIS — Z114 Encounter for screening for human immunodeficiency virus [HIV]: Secondary | ICD-10-CM

## 2023-05-25 NOTE — Patient Instructions (Signed)
No follow-ups on file.        Great to see you today.  I have refilled the medication(s) we provide.   If labs were collected, we will inform you of lab results once received either by echart message or telephone call.   - echart message- for normal results that have been seen by the patient already.   - telephone call: abnormal results or if patient has not viewed results in their echart.  

## 2023-05-25 NOTE — Progress Notes (Signed)
No show

## 2023-06-09 ENCOUNTER — Encounter: Payer: Self-pay | Admitting: Family Medicine

## 2023-07-27 ENCOUNTER — Encounter: Payer: Self-pay | Admitting: Family Medicine

## 2023-07-27 ENCOUNTER — Ambulatory Visit: Payer: BC Managed Care – PPO | Admitting: Family Medicine

## 2023-07-27 ENCOUNTER — Ambulatory Visit (HOSPITAL_BASED_OUTPATIENT_CLINIC_OR_DEPARTMENT_OTHER)
Admission: RE | Admit: 2023-07-27 | Discharge: 2023-07-27 | Disposition: A | Discharge status: Home / Self Care | Payer: BC Managed Care – PPO | Source: Ambulatory Visit | Attending: Family Medicine | Admitting: Family Medicine

## 2023-07-27 VITALS — BP 118/76 | HR 66 | Temp 98.6°F | Ht 67.0 in | Wt 173.6 lb

## 2023-07-27 DIAGNOSIS — M5412 Radiculopathy, cervical region: Secondary | ICD-10-CM | POA: Insufficient documentation

## 2023-07-27 MED ORDER — PREDNISONE 20 MG PO TABS: ORAL_TABLET | ORAL | 0 refills | Status: AC

## 2023-07-27 NOTE — Patient Instructions (Addendum)

## 2023-07-27 NOTE — Progress Notes (Signed)
ANWAR BAACK , [birth year], 32 y.o., male MRN: [id number] Patient Care Team    Relationship Specialty Notifications Start End  Natalia Leatherwood, DO PCP - General Family Medicine  03/16/16     Chief Complaint  Patient presents with   hand numbness    C/o right arm and hand numbness; middle finger on left hand mostly at night. Neck sensitivity; over a month  Has had 3 surgeries on right shoulder last on 2015     Subjective: JEREMIH GERLICH is a 32 y.o. Pt presents for an OV with complaints of cervical neck discomfort with numbness/pain radiation down bilateral arms and into hands of 3 weeks duration.  He reports he first noticed the numbness and discomfort in his thumb index and middle finger on his right hand.  He then noticed the middle finger on his left hand started to become affected.  He has noticed the discomfort seems to be worse at night when he is sleeping.  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  Prior note 05/2020: Pt presents for an OV with complaints of back pain.  Patient reports a little over a week ago he was experiencing left upper cervical pain.  He states he woke up with it on a Sunday morning.  He does not recall any injury or overexertion prior to onset.  He went to a chiropractor on Monday and Wednesday following onset.  He returned to light duty at work that Thursday.  Patient reports the chiropractor adjusted his spine and he initially felt well.  However this past Saturday (7 days ago), his lower back started bothering him.  He states it hurts worse on the right side and midline of his lower back.  He reports he will feel the radiation of the pain down his legs and it makes him feel weak in his legs and he feels like they are going to give out.  He reports the pain is worse when standing up or bending.  He also noted when  laying on his stomach and trying to get up the pain was worse.  He denies any constant radiation for pain down his legs.  He denies any numbness or tingling in his lower extremities.  He denies any bladder or bowel dysfunction.  He has used icy hot, Advil, Epson salt soaks and old oxycodone to help with his discomfort.  He states he is worried about work because he had to take time off for his back and it is not a workers comp case. Patient denies prior back injury or surgery.  Lumbar xray 05/2020 There is no evidence of lumbar spine fracture. Alignment is normal. Intervertebral disc spaces are maintained. IMPRESSION: Negative.  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  There  is mild spondylosis at C4-5 with small endplate osteophytes.  There  is mild foraminal encroachment bilaterally at that level.  All  other levels appear normal.  No soft tissue swelling.  No acute or  apparently traumatic finding.  IMPRESSION:  No acute or traumatic finding.  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       05/06/2023   10:58 AM 05/29/2021    9:18 AM 06/05/2020  3:58 PM 06/08/2017    8:06 AM 06/01/2016    5:32 PM  Depression screen PHQ 2/9  Decreased Interest 1 0 0 1 1  Down, Depressed, Hopeless 1 0 0 2 1  PHQ - 2 Score 2 0 0 3 2  Altered sleeping 1  0 3 1  Tired, decreased energy 1  0 2 2  Change in appetite 0  0 2 0  Feeling bad or failure about yourself  1  0 2 0  Trouble concentrating 0  0 1 1  Moving slowly or fidgety/restless 0  0 0 1  Suicidal thoughts 0  0 0 0  PHQ-9 Score 5  0 13 7  Difficult doing work/chores Somewhat difficult  Not difficult at all  Somewhat difficult    Allergies  Allergen Reactions   Bee Venom Swelling   Social History   Social History Narrative   Single. No children.   Education: HS grad   Employed: [employer]    Drinks caffeine, takes daily vitamin    Wears seatbelt, exercises> 3x week   Smoke detector in the home.    Firearms in the home in a locked case.   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 Surgical screws-glenoid present   WISDOM TOOTH EXTRACTION     Family History  Problem Relation Age of Onset   Heart disease Mother    Stroke Father    Allergies as of 07/27/2023       Reactions   Bee Venom Swelling        Medication List        Accurate as of July 27, 2023  3:35 PM. If you have any questions, ask your nurse or doctor.          predniSONE 20 MG tablet Commonly known as: DELTASONE 60 mg x3d, 40 mg x3d, 20 mg x2d, 10 mg x2d Started by: Felix Pacini   SUMAtriptan 50 MG tablet Commonly known as: Imitrex Take 1 tablet (50 mg total) by mouth every 2 (two) hours as needed for migraine. May repeat in 2 hours if headache persists or recurs.        All past medical history, surgical history, allergies, family history, immunizations andmedications were updated in the EMR today and reviewed under the history and medication portions of their EMR.     ROS Negative, with the exception of above mentioned in HPI   Objective:  BP 118/76   Pulse 66   Temp 98.6 F (37 C)   Ht 5\' 7"  (1.702 m)   Wt 173 lb 9.6 oz (78.7 kg)   SpO2 96%   BMI  27.19 kg/m  Body mass index is 27.19 kg/m. Physical Exam Vitals and nursing note reviewed.  Constitutional:      General:  He is not in acute distress.    Appearance: Normal appearance. He is not ill-appearing, toxic-appearing or diaphoretic.  HENT:     Head: Normocephalic and atraumatic.  Eyes:     General: No scleral icterus.       Right eye: No discharge.        Left eye: No discharge.     Extraocular Movements: Extraocular movements intact.     Pupils: Pupils are equal, round, and reactive to light.  Musculoskeletal:     Cervical back: Deformity (Congenital failure of separation C3-C4), tenderness and bony tenderness (C5 C6) present. No swelling. Pain with movement present. Decreased range of motion.     Comments: Decreased range of motion bilateral sidebending and extension.  Patient reports pain is worse on extension. Positive Tinel's at left wrist for reproduction of symptoms to left middle finger. Negative Tinel's right wrist.  Skin:    General: Skin is warm and dry.     Coloration: Skin is not jaundiced or pale.     Findings: No rash.  Neurological:     Mental Status: He is alert and oriented to person, place, and time. Mental status is at baseline.  Psychiatric:        Mood and Affect: Mood normal.        Behavior: Behavior normal.        Thought Content: Thought content normal.        Judgment: Judgment normal.      No results found. No results found. No results found for this or any previous visit (from the past 24 hour(s)).  Assessment/Plan: ELIYA BLANKMAN is a 32 y.o. male present for OV for  Cervical radiculopathy - DG Cervical Spine Complete; Future - Heat application can be helpful -Prednisone taper prescribed -Could consider Lyrica in the future. -Consider neurology referral for nerve conduction testing.  He does have some component of possible carpal tunnel distribution, with worsening nighttime symptoms versus known cervical spine abnormalities in the  past. -Patient will be called with results of cervical spine x-ray, close follow-up will be scheduled thereafter in which we can discuss further plan depending upon clinical outcome and image results.     Reviewed expectations re: course of current medical issues. Discussed self-management of symptoms. Outlined signs and symptoms indicating need for more acute intervention. Patient verbalized understanding and all questions were answered. Patient received an After-Visit Summary.    Orders Placed This Encounter  Procedures   DG Cervical Spine Complete   Meds ordered this encounter  Medications   predniSONE (DELTASONE) 20 MG tablet    Sig: 60 mg x3d, 40 mg x3d, 20 mg x2d, 10 mg x2d    Dispense:  18 tablet    Refill:  0   Referral Orders  No referral(s) requested today     Note is dictated utilizing voice recognition software. Although note has been proof read prior to signing, occasional typographical errors still can be missed. If any questions arise, please do not hesitate to call for verification.   electronically signed by:  Felix Pacini, DO  [facility] - OR

## 2023-07-28 ENCOUNTER — Telehealth: Payer: Self-pay | Admitting: Family Medicine

## 2023-07-28 NOTE — Telephone Encounter (Signed)
Pt given results/recommendations.

## 2023-07-28 NOTE — Telephone Encounter (Signed)
Please call patient: His neck x-ray is about the same as it was on his prior x-ray. Encouraged him to take the prednisone as prescribed. Follow-up in 2-4 weeks if symptoms do not resolve or return quickly.  We would then need to consider further evaluation and likely nerve conduction studies.

## 2023-08-08 ENCOUNTER — Other Ambulatory Visit: Payer: Self-pay | Admitting: Family Medicine

## 2024-09-08 ENCOUNTER — Encounter (HOSPITAL_COMMUNITY): Payer: Self-pay

## 2024-09-08 ENCOUNTER — Other Ambulatory Visit: Payer: Self-pay

## 2024-09-08 ENCOUNTER — Emergency Department (HOSPITAL_COMMUNITY)
Admission: EM | Admit: 2024-09-08 | Discharge: 2024-09-08 | Disposition: A | Discharge status: Home / Self Care | Attending: Emergency Medicine | Admitting: Emergency Medicine

## 2024-09-08 DIAGNOSIS — W312XXA Contact with powered woodworking and forming machines, initial encounter: Secondary | ICD-10-CM | POA: Diagnosis not present

## 2024-09-08 DIAGNOSIS — Z23 Encounter for immunization: Secondary | ICD-10-CM | POA: Diagnosis not present

## 2024-09-08 DIAGNOSIS — Y93H2 Activity, gardening and landscaping: Secondary | ICD-10-CM | POA: Diagnosis not present

## 2024-09-08 DIAGNOSIS — S41112A Laceration without foreign body of left upper arm, initial encounter: Secondary | ICD-10-CM

## 2024-09-08 DIAGNOSIS — S51812A Laceration without foreign body of left forearm, initial encounter: Secondary | ICD-10-CM | POA: Diagnosis present

## 2024-09-08 MED ORDER — DOUBLE ANTIBIOTIC 500-10000 UNIT/GM EX OINT
TOPICAL_OINTMENT | Freq: Once | CUTANEOUS | Status: AC
  Filled 2024-09-08: qty 1

## 2024-09-08 MED ORDER — POVIDONE-IODINE 10 % EX SOLN
CUTANEOUS | Status: AC
  Filled 2024-09-08: qty 14.8

## 2024-09-08 MED ORDER — TETANUS-DIPHTH-ACELL PERTUSSIS 5-2.5-18.5 LF-MCG/0.5 IM SUSY
0.5000 mL | PREFILLED_SYRINGE | Freq: Once | INTRAMUSCULAR | Status: AC
  Administered 2024-09-08: 0.5 mL via INTRAMUSCULAR
  Filled 2024-09-08: qty 0.5

## 2024-09-08 MED ORDER — LIDOCAINE-EPINEPHRINE (PF) 2 %-1:200000 IJ SOLN
20.0000 mL | Freq: Once | INTRAMUSCULAR | Status: AC
  Administered 2024-09-08: 20 mL
  Filled 2024-09-08: qty 20

## 2024-09-08 MED ORDER — CEPHALEXIN 500 MG PO CAPS: 500.0000 mg | ORAL_CAPSULE | Freq: Three times a day (TID) | ORAL | 0 refills | Status: AC

## 2024-09-08 NOTE — ED Triage Notes (Signed)
 Pt arrived with left arm laceration; injury not visualized at this time due to makeshift wrapping and possible bleeding; waiting on MD for visualization. Distal pulses palpated on injured extremity.

## 2024-09-08 NOTE — Discharge Instructions (Signed)
 Please follow-up with your primary care doctor or return to the emergency department in 10 days for suture removal.  Please continue good wound care as discussed at the bedside.  Take all antibiotics as directed.  Return to emergency department immediately for any new or worsening symptoms or if any signs of infection are noted.

## 2024-09-08 NOTE — ED Provider Notes (Signed)
 [facility] AT [facility] Provider Note   CSN: [id number] Arrival date & time: 09/08/24  1202     Patient presents with: Laceration   Rodney Brown is a 33 y.o. male.   Patient is a 33 year old male who presents emergency department chief complaint of a laceration to his left forearm along the medial aspect of the antecubital region.  Patient notes that he was cutting some vines with a saw when he excellently cut himself.  Patient denies any numbness or paresthesias distally.  He denies any foreign body sensation to the arm.  He still has full range of motion noted throughout.  He has no known history of bleeding disorders or current anticoagulation use.  He is unsure of his last tetanus shot.   Laceration      Prior to Admission medications   Medication Sig Start Date End Date Taking? Authorizing Provider  predniSONE  (DELTASONE ) 20 MG tablet 60 mg x3d, 40 mg x3d, 20 mg x2d, 10 mg x2d 07/27/23   Kuneff, Renee A, DO  SUMAtriptan  (IMITREX ) 50 MG tablet Take 1 tablet (50 mg total) by mouth every 2 (two) hours as needed for migraine. May repeat in 2 hours if headache persists or recurs. 05/06/23   Kuneff, Renee A, DO    Allergies: Bee venom    Review of Systems  Skin:        Laceration to the left forearm  All other systems reviewed and are negative.   Updated Vital Signs BP 121/82   Pulse (!) 113   Temp 99.4 F (37.4 C) (Oral)   Resp 18   Ht 5' 7 (1.702 m)   Wt 77.1 kg   SpO2 96%   BMI 26.63 kg/m   Physical Exam Vitals and nursing note reviewed.  Constitutional:      Appearance: Normal appearance.  HENT:     Head: Normocephalic and atraumatic.  Eyes:     Extraocular Movements: Extraocular movements intact.     Conjunctiva/sclera: Conjunctivae normal.     Pupils: Pupils are equal, round, and reactive to light.  Cardiovascular:     Rate and Rhythm: Normal rate and regular rhythm.     Pulses: Normal pulses.     Heart sounds: Normal  heart sounds.  Pulmonary:     Effort: Pulmonary effort is normal. No respiratory distress.  Musculoskeletal:        General: Normal range of motion.     Comments: No tenderness to palpation noted over the bony processes of the left upper extremity, laceration noted to the left AC region described in skin exam, radial pulse 2+ distally, sensation intact distally, full range of motion noted to distal digits and at wrist  Skin:    General: Skin is warm and dry.     Comments: 4 cm laceration noted along the medial aspect of the left antecubital region, no involvement of the deep structures, no involvement of the arterial structures or venous structures, no obvious foreign body noted within the wound, wound evaluated to depth with adequate lighting  Neurological:     General: No focal deficit present.     Mental Status: He is alert and oriented to person, place, and time. Mental status is at baseline.     Sensory: No sensory deficit.     Motor: No weakness.     Coordination: Coordination normal.     (all labs ordered are listed, but only abnormal results are displayed) Labs Reviewed - No data  to display  EKG: None  Radiology: No results found.   .Laceration Repair  Date/Time: 09/08/2024 1:44 PM  Performed by: Daralene Lonni BIRCH, PA-C Authorized by: Daralene Lonni BIRCH, PA-C   Consent:    Consent obtained:  Verbal   Consent given by:  Patient   Risks discussed:  Infection, pain, need for additional repair and poor cosmetic result   Alternatives discussed:  No treatment Universal protocol:    Procedure explained and questions answered to patient or proxy's satisfaction: yes     Immediately prior to procedure, a time out was called: yes     Patient identity confirmed:  Verbally with patient, arm band, provided demographic data and hospital-assigned identification number Anesthesia:    Anesthesia method:  Local infiltration   Local anesthetic:  Lidocaine  2% WITH  epi Laceration details:    Location:  Shoulder/arm   Shoulder/arm location:  L lower arm   Length (cm):  4 Pre-procedure details:    Preparation:  Patient was prepped and draped in usual sterile fashion and imaging obtained to evaluate for foreign bodies Exploration:    Hemostasis achieved with:  Epinephrine  and direct pressure   Wound exploration: wound explored through full range of motion and entire depth of wound visualized     Wound extent: fascia not violated, no foreign body, no signs of injury, no nerve damage, no tendon damage, no underlying fracture and no vascular damage     Contaminated: no   Treatment:    Area cleansed with:  Povidone-iodine  and saline   Amount of cleaning:  Extensive   Debridement:  None   Undermining:  None   Scar revision: no   Skin repair:    Repair method:  Sutures   Suture size:  4-0   Suture material:  Prolene   Suture technique:  Simple interrupted   Number of sutures:  11 Approximation:    Approximation:  Close Repair type:    Repair type:  Simple Post-procedure details:    Dressing:  Antibiotic ointment and non-adherent dressing   Procedure completion:  Tolerated well, no immediate complications    Medications Ordered in the ED  polymixin-bacitracin  (POLYSPORIN ) ointment (has no administration in time range)  lidocaine -EPINEPHrine  (XYLOCAINE  W/EPI) 2 %-1:200000 (PF) injection 20 mL (20 mLs Infiltration Given by Other 09/08/24 1249)  Tdap (BOOSTRIX ) injection 0.5 mL (0.5 mLs Intramuscular Given 09/08/24 1248)  povidone-iodine  (BETADINE ) 10 % external solution (  Given by Other 09/08/24 1305)                                    Medical Decision Making Patient is doing well at this time and is stable for discharge home.  Laceration to the left forearm was repaired at the bedside.  He had no involvement of the deep structures and did not warrant deep suturing.  Patient had no involvement of the arteriovenous structures.  There was no  involvement of ligaments, tendons or muscular structures.  He was neurovascularly intact distally with strong peripheral pulses.  There was no foreign bodies noted within the wound and wound was evaluated to depth with adequate lighting.  He had no bony tenderness throughout.  Do not suspect imaging is warranted at this time.  Tetanus shot was updated.  Continue good wound care and outpatient basis was discussed.  Strict turn precautions were provided for any new or worsening symptoms.  Patient voiced understanding and had no  additional questions.  Risk OTC drugs. Prescription drug management.        Final diagnoses:  None    ED Discharge Orders     None          Daralene Lonni JONETTA DEVONNA 09/08/24 1346    Suzette Pac, MD 09/13/24 1105
